# Patient Record
Sex: Male | Born: 1950 | Race: Black or African American | Hispanic: No | Marital: Married | Smoking: Former smoker
Health system: Southern US, Community
[De-identification: ages and names within clinical notes are randomized; demographics above are authoritative.]

## PROBLEM LIST (undated history)

## (undated) DIAGNOSIS — Z972 Presence of dental prosthetic device (complete) (partial): Secondary | ICD-10-CM

## (undated) DIAGNOSIS — Z9089 Acquired absence of other organs: Secondary | ICD-10-CM

## (undated) DIAGNOSIS — I509 Heart failure, unspecified: Secondary | ICD-10-CM

## (undated) DIAGNOSIS — N183 Chronic kidney disease, stage 3 unspecified: Secondary | ICD-10-CM

## (undated) DIAGNOSIS — F4321 Adjustment disorder with depressed mood: Secondary | ICD-10-CM

## (undated) DIAGNOSIS — M726 Necrotizing fasciitis: Secondary | ICD-10-CM

## (undated) DIAGNOSIS — I700 Atherosclerosis of aorta: Secondary | ICD-10-CM

## (undated) DIAGNOSIS — I2510 Atherosclerotic heart disease of native coronary artery without angina pectoris: Secondary | ICD-10-CM

## (undated) DIAGNOSIS — E039 Hypothyroidism, unspecified: Secondary | ICD-10-CM

## (undated) DIAGNOSIS — H4311 Vitreous hemorrhage, right eye: Secondary | ICD-10-CM

## (undated) DIAGNOSIS — I10 Essential (primary) hypertension: Secondary | ICD-10-CM

## (undated) DIAGNOSIS — N400 Enlarged prostate without lower urinary tract symptoms: Secondary | ICD-10-CM

## (undated) DIAGNOSIS — I219 Acute myocardial infarction, unspecified: Secondary | ICD-10-CM

## (undated) DIAGNOSIS — F17211 Nicotine dependence, cigarettes, in remission: Secondary | ICD-10-CM

## (undated) DIAGNOSIS — L602 Onychogryphosis: Secondary | ICD-10-CM

## (undated) DIAGNOSIS — E119 Type 2 diabetes mellitus without complications: Secondary | ICD-10-CM

## (undated) DIAGNOSIS — M549 Dorsalgia, unspecified: Secondary | ICD-10-CM

## (undated) DIAGNOSIS — M1990 Unspecified osteoarthritis, unspecified site: Secondary | ICD-10-CM

## (undated) DIAGNOSIS — E669 Obesity, unspecified: Secondary | ICD-10-CM

## (undated) DIAGNOSIS — K760 Fatty (change of) liver, not elsewhere classified: Secondary | ICD-10-CM

## (undated) DIAGNOSIS — E785 Hyperlipidemia, unspecified: Secondary | ICD-10-CM

## (undated) DIAGNOSIS — H6121 Impacted cerumen, right ear: Secondary | ICD-10-CM

## (undated) HISTORY — DX: Vitreous hemorrhage, right eye: H43.11

## (undated) HISTORY — DX: Nicotine dependence, cigarettes, in remission: F17.211

## (undated) HISTORY — DX: Atherosclerosis of aorta: I70.0

## (undated) HISTORY — DX: Dorsalgia, unspecified: M54.9

## (undated) HISTORY — DX: Necrotizing fasciitis: M72.6

## (undated) HISTORY — DX: Atherosclerotic heart disease of native coronary artery without angina pectoris: I25.10

## (undated) HISTORY — DX: Chronic kidney disease, stage 3 unspecified: N18.30

## (undated) HISTORY — DX: Heart failure, unspecified: I50.9

## (undated) HISTORY — DX: Adjustment disorder with depressed mood: F43.21

## (undated) HISTORY — DX: Hyperlipidemia, unspecified: E78.5

## (undated) HISTORY — DX: Hypothyroidism, unspecified: E03.9

## (undated) HISTORY — DX: Presence of dental prosthetic device (complete) (partial): Z97.2

## (undated) HISTORY — DX: Benign prostatic hyperplasia without lower urinary tract symptoms: N40.0

## (undated) HISTORY — DX: Unspecified osteoarthritis, unspecified site: M19.90

## (undated) HISTORY — DX: Onychogryphosis: L60.2

## (undated) HISTORY — DX: Obesity, unspecified: E66.9

## (undated) HISTORY — PX: COLON SURGERY: SHX602

## (undated) HISTORY — DX: Acquired absence of other organs: Z90.89

## (undated) HISTORY — DX: Impacted cerumen, right ear: H61.21

## (undated) HISTORY — DX: Fatty (change of) liver, not elsewhere classified: K76.0

---

## 1999-04-25 ENCOUNTER — Encounter: Admission: RE | Admit: 1999-04-25 | Discharge: 1999-07-24 | Payer: Self-pay | Admitting: *Deleted

## 1999-08-07 ENCOUNTER — Ambulatory Visit (HOSPITAL_COMMUNITY): Admission: RE | Admit: 1999-08-07 | Discharge: 1999-08-07 | Payer: Self-pay | Admitting: *Deleted

## 2000-02-19 ENCOUNTER — Encounter: Admission: RE | Admit: 2000-02-19 | Discharge: 2000-02-26 | Payer: Self-pay | Admitting: *Deleted

## 2000-04-15 ENCOUNTER — Encounter: Admission: RE | Admit: 2000-04-15 | Discharge: 2000-07-14 | Payer: Self-pay | Admitting: *Deleted

## 2002-04-08 ENCOUNTER — Emergency Department (HOSPITAL_COMMUNITY): Admission: EM | Admit: 2002-04-08 | Discharge: 2002-04-08 | Payer: Self-pay | Admitting: Emergency Medicine

## 2002-04-09 ENCOUNTER — Encounter: Payer: Self-pay | Admitting: Family Medicine

## 2002-04-09 ENCOUNTER — Inpatient Hospital Stay (HOSPITAL_COMMUNITY): Admission: EM | Admit: 2002-04-09 | Discharge: 2002-05-01 | Payer: Self-pay | Admitting: Emergency Medicine

## 2002-04-09 ENCOUNTER — Encounter: Payer: Self-pay | Admitting: Emergency Medicine

## 2002-04-10 HISTORY — PX: OTHER SURGICAL HISTORY: SHX169

## 2002-04-13 ENCOUNTER — Encounter: Payer: Self-pay | Admitting: Family Medicine

## 2002-04-17 ENCOUNTER — Encounter: Payer: Self-pay | Admitting: General Surgery

## 2002-04-28 ENCOUNTER — Encounter: Admission: RE | Admit: 2002-04-28 | Discharge: 2002-04-28 | Payer: Self-pay | Admitting: Family Medicine

## 2002-05-07 ENCOUNTER — Encounter: Payer: Self-pay | Admitting: Plastic Surgery

## 2002-05-08 ENCOUNTER — Inpatient Hospital Stay (HOSPITAL_COMMUNITY): Admission: RE | Admit: 2002-05-08 | Discharge: 2002-05-13 | Payer: Self-pay | Admitting: Plastic Surgery

## 2002-05-08 HISTORY — PX: SKIN GRAFT: SHX250

## 2002-07-22 ENCOUNTER — Encounter: Payer: Self-pay | Admitting: General Surgery

## 2002-07-27 ENCOUNTER — Encounter (INDEPENDENT_AMBULATORY_CARE_PROVIDER_SITE_OTHER): Payer: Self-pay | Admitting: *Deleted

## 2002-07-27 ENCOUNTER — Inpatient Hospital Stay (HOSPITAL_COMMUNITY): Admission: RE | Admit: 2002-07-27 | Discharge: 2002-08-01 | Payer: Self-pay | Admitting: General Surgery

## 2002-07-27 HISTORY — PX: OTHER SURGICAL HISTORY: SHX169

## 2003-06-17 ENCOUNTER — Ambulatory Visit (HOSPITAL_COMMUNITY): Admission: RE | Admit: 2003-06-17 | Discharge: 2003-06-17 | Payer: Self-pay | Admitting: Orthopedic Surgery

## 2003-06-17 ENCOUNTER — Encounter: Payer: Self-pay | Admitting: Orthopedic Surgery

## 2006-11-21 ENCOUNTER — Encounter: Admission: RE | Admit: 2006-11-21 | Discharge: 2006-11-21 | Payer: Self-pay | Admitting: Cardiology

## 2007-11-05 ENCOUNTER — Encounter: Admission: RE | Admit: 2007-11-05 | Discharge: 2007-11-05 | Payer: Self-pay | Admitting: Cardiology

## 2008-02-22 ENCOUNTER — Inpatient Hospital Stay (HOSPITAL_COMMUNITY): Admission: EM | Admit: 2008-02-22 | Discharge: 2008-02-25 | Payer: Self-pay | Admitting: Emergency Medicine

## 2008-02-22 ENCOUNTER — Ambulatory Visit: Payer: Self-pay | Admitting: Cardiology

## 2008-02-23 ENCOUNTER — Encounter (INDEPENDENT_AMBULATORY_CARE_PROVIDER_SITE_OTHER): Payer: Self-pay | Admitting: Emergency Medicine

## 2008-09-21 ENCOUNTER — Ambulatory Visit (HOSPITAL_COMMUNITY): Admission: RE | Admit: 2008-09-21 | Discharge: 2008-09-21 | Payer: Self-pay | Admitting: Cardiology

## 2008-09-21 ENCOUNTER — Ambulatory Visit: Payer: Self-pay | Admitting: Vascular Surgery

## 2008-09-21 ENCOUNTER — Encounter (INDEPENDENT_AMBULATORY_CARE_PROVIDER_SITE_OTHER): Payer: Self-pay | Admitting: Cardiology

## 2008-11-24 ENCOUNTER — Encounter (HOSPITAL_COMMUNITY): Admission: RE | Admit: 2008-11-24 | Discharge: 2009-02-22 | Payer: Self-pay | Admitting: Cardiology

## 2009-09-27 ENCOUNTER — Ambulatory Visit: Payer: Self-pay | Admitting: Vascular Surgery

## 2009-09-27 ENCOUNTER — Encounter (INDEPENDENT_AMBULATORY_CARE_PROVIDER_SITE_OTHER): Payer: Self-pay | Admitting: Cardiology

## 2009-09-27 ENCOUNTER — Ambulatory Visit (HOSPITAL_COMMUNITY): Admission: RE | Admit: 2009-09-27 | Discharge: 2009-09-27 | Payer: Self-pay | Admitting: Cardiology

## 2011-01-11 ENCOUNTER — Other Ambulatory Visit (HOSPITAL_COMMUNITY): Payer: Self-pay | Admitting: Cardiology

## 2011-01-11 DIAGNOSIS — I209 Angina pectoris, unspecified: Secondary | ICD-10-CM

## 2011-02-07 ENCOUNTER — Ambulatory Visit (HOSPITAL_COMMUNITY): Admission: RE | Admit: 2011-02-07 | Payer: Self-pay | Source: Ambulatory Visit

## 2011-02-07 ENCOUNTER — Encounter (HOSPITAL_COMMUNITY)
Admission: RE | Admit: 2011-02-07 | Discharge: 2011-02-07 | Disposition: A | Discharge status: Home / Self Care | Payer: Medicare Other | Source: Ambulatory Visit | Attending: Cardiology | Admitting: Cardiology

## 2011-02-07 ENCOUNTER — Ambulatory Visit (HOSPITAL_COMMUNITY)
Admission: RE | Admit: 2011-02-07 | Discharge: 2011-02-07 | Payer: Self-pay | Source: Ambulatory Visit | Attending: Cardiology | Admitting: Cardiology

## 2011-02-07 DIAGNOSIS — I209 Angina pectoris, unspecified: Secondary | ICD-10-CM

## 2011-02-07 DIAGNOSIS — Z8249 Family history of ischemic heart disease and other diseases of the circulatory system: Secondary | ICD-10-CM | POA: Insufficient documentation

## 2011-02-07 DIAGNOSIS — E119 Type 2 diabetes mellitus without complications: Secondary | ICD-10-CM | POA: Insufficient documentation

## 2011-02-07 MED ORDER — TECHNETIUM TC 99M TETROFOSMIN IV KIT
30.0000 | PACK | Freq: Once | INTRAVENOUS | Status: AC | PRN
  Administered 2011-02-07: 30 via INTRAVENOUS

## 2011-02-07 MED ORDER — TECHNETIUM TC 99M TETROFOSMIN IV KIT
10.0000 | PACK | Freq: Once | INTRAVENOUS | Status: AC | PRN
  Administered 2011-02-07: 10 via INTRAVENOUS

## 2011-02-20 NOTE — Consult Note (Signed)
[name], [name] NO.:  [id number]   MEDICAL RECORD NO.:  [id number]          PATIENT TYPE:  INP   LOCATION:  2912                         FACILITY:  [facility]   PHYSICIAN:  Lonia Blood, M.D.DATE OF BIRTH:  Dec 17, 1950   DATE OF CONSULTATION:  DATE OF DISCHARGE:                                 CONSULTATION   REFERRING PHYSICIAN:  Jonelle Sidle, MD   PRIMARY CARE PHYSICIAN:  Osvaldo Shipper. Spruill, M.D.   REASON FOR CONSULTATION:  Severely uncontrolled diabetes mellitus.   HISTORY OF PRESENT ILLNESS:  Mr. Zylstra is a 60 year old gentleman  with a known long standing history of diabetes mellitus.  He is being  admitted to the cardiology service under suspicion of possible acute  coronary syndrome.  During his admission, it has been appreciated that  he is suffering with severely uncontrolled diabetes.  Serum glucose was  noted to 694 in the emergency room and capillary blood glucose has been  in excess of 800.  While the cardiology team is emergently attending to  the patient's possible acute coronary syndrome, they have attempted to  reach the patient's primary care physician for control of his  hyperglycemia.  Unfortunately, they were not able to do so.  As a  result, they have asked if the [facility] service would be willing to  attend the patient's diabetes and have agreed to do so.   The patient states that he was sitting in church today when he suddenly  began to experience generalized severe weakness.  Apparently, shortly  thereafter he passed out.  EMS was summoned and the patient was  transported to the ER.  For details concerning his presentation, please  see dictated history and physical as per the cardiology team.  At the  present time, the patient is resting in his coronary ICU bed.  He denies  chest complaints.  He denies fevers, chills, nausea, or vomiting.  He  denies any recent illnesses.  He states that he has been compliant with  all  of his medications though past records raise the question of  possible medical noncompliance.   REVIEW OF SYSTEMS:  Unremarkable with the exception of positive elements  noted in the history of present illness above.   PAST MEDICAL HISTORY:  1. History of Fornes gangrene, 2003.      a.     Extensive multiple I&Ds.      b.     Diverting colostomy with ultimate takedown of colostomy.  2. Multiple split thickness skin grafts required for wound closure.  3. Diabetes mellitus, type 2.  4. 40 pack year history of tobacco abuse - discontinued 3 years ago.  5. Hyperlipidemia.   OUTPATIENT MEDICATIONS:  1. Actoplus MET, 500/15 b.i.d.  2. Zocor, unknown dose every day.  3. Aspirin every day.  4. Blood pressure medication, unknown.   ALLERGIES:  No known drug allergies   FAMILY HISTORY:  The patient's father is deceased from lung cancer but  was a smoker and also was known to have had asbestos exposure.  The  patient's mother s alive.  There is no apparent  history of early  coronary artery disease in the family.   SOCIAL HISTORY:  The patient occasionally partakes of alcohol.  He is  married.  He is disabled from factory work.   DATA REVIEWED:  CBC is unremarkable.  CMET is remarkable for a low  sodium at 128, elevated creatinine at 1.9, serum glucose of 672.  LFTs  are normal.  Albumin of 3.9.  Urinalysis reveals ketones and greater  than 100 glucose but is otherwise unremarkable.  Coags are normal.  D-  dimer is elevated at 0.5.  CT scan of the head reveals no acute disease.  Chest x-ray reveals no acute disease.  LDL is noted to be 150 with an  HDL of 37.  EKGs are reviewed, see cardiology comments.   PHYSICAL EXAMINATION:  Temperature is not really available but the  patient is not febrile to simple touch.  Blood pressure is 95/100, heart  rate 70, respiratory rate 24, oxygen saturation is 99% on 4 liters per  minute nasal cannula.  GENERAL:  Well-developed, well-nourished  gentleman in acute respiratory  distress.  HEENT:  Normocephalic, atraumatic.  Pupils equal, round, and reactive to  light and accommodation.  Extraocular muscles intact bilaterally.  __________ clear.  LUNGS:  Clear to auscultation bilaterally without wheezes or rhonchi.  CARDIOVASCULAR:  Distant but regular without gallop or rub.  ABDOMEN:  Nontender, nondistended, soft.  Bowel sounds present.  No  hepatosplenomegaly, no rebound, no ascites.  EXTREMITIES:  No significant clubbing, cyanosis, or edema of bilateral  lower extremities.  NEUROLOGIC:  Alert and oriented but somewhat lethargic.  Cranial nerves  II-XII intact bilaterally, 5/5 strength in bilateral  upper and lower  extremities, intact sensation to touch throughout.   RECOMMENDATIONS:  1. Severe hyperglycemia without evidence of diabetic ketoacidosis:  We      will initiate Glu commander protocol.  Orders have been filled out      and placed within the patient's chart.  Because of his severe      hyperglycemia, we will place the patient on n.p.o. status with      exception to non-caloric clear liquids while he is on an insulin      drip.  We will follow his potassium very closely as infusion of IV      insulin in the setting of severe hyperglycemia frequently causes      hyperkalemia.  We will recheck the potassium tonight and supplement      as appropriate.  At such timet that the patient's blood sugar      becomes well controlled on insulin drip, we will administer Lantus      insulin and then transition the patient appropriately to a sliding      scale of insulin.  Oral medications will not be resumed until close      to the time for the patient's discharge.  There is no clear      inciting etiology for the patient's severe hyperglycemia.      Certainly, noncompliance could be a component. We will check a      hemoglobin A1c with out next lab test.  2. Renal insufficiency:  The patient has no history of renal       insufficiency and has a baseline creatinine of 1.0 as measured      during his hospitalization in 2003.  I expect his current renal      insufficiency is simply secondary to a severe volume depletion in  the setting of marked hyperglycemia.  I will hydrate the patient      aggressively with 200-cc per hour of normal saline in addition to      any fluids recommended by the glucose stabilizer.  We will then cut      him back to a rate of 150-cc an hour.  The patient is not known to      have a history of heart failure and given his relative young age, I      feel that he can tolerate these fluids.  We will followup a renal      function panel in the morning.  3. Hyponatremia:  This is clearly a pseudohyponatremia due to severe      hyperglycemia.  With correction of the patient's hyperglycemia, I      suspect that his sodium will normalize.   Thank you very much for your consultation on this pleasant gentleman.  I  am aware that Dr. Shana Chute is his primary care physician.  I have been  informed that multiple attempts were made to contact Dr. Shana Chute and  that these attempts have failed.  I am happy care for Mr. Silverstein at  the present time.  I will attempt to call Dr. Shana Chute myself in the  morning and if he is able to be contacted, I will be happy to turn over  ongoing diabetes management to Dr. Shana Chute.  In the meantime, the  [facility] service is happy to provide ongoing consultative care for the  patient's severe hyperglycemia.      Lonia Blood, M.D.  Electronically Signed     JTM/MEDQ  D:  02/22/2008  T:  02/22/2008  Job:  [id number]   cc:   Osvaldo Shipper. Spruill, M.D.  Jonelle Sidle, MD

## 2011-02-20 NOTE — Discharge Summary (Signed)
[name], [name]            ACCOUNT NO.:  [id number]   MEDICAL RECORD NO.:  [id number]          PATIENT TYPE:  INP   LOCATION:  2002                         FACILITY:  [facility]   PHYSICIAN:  Osvaldo Shipper. Spruill, M.D.DATE OF BIRTH:  Nov 15, 1950   DATE OF ADMISSION:  02/22/2008  DATE OF DISCHARGE:  02/25/2008                               DISCHARGE SUMMARY   DISCHARGE DIAGNOSES:  1. Diabetes mellitus, adult onset.  2. Hypoosmolality.  3. Volume depletion.  4. Hypotension.  5. Hypertension.  6. Hyperlipidemia.   The initial history and physical for this patient was completed by Dr.  Nona Dell, cardiology.  It was initially felt that the patient was  a cardiac STEMI patient.  This is a 59 year old male with no previous  history of coronary artery disease who had not been feeling his usual  self a day or two prior to this hospital admission.  He went to church  on Feb 22, 2008.  While at church he became weak, he had some  diaphoresis with nausea, had some pain in the left arm and left neck.  There was no significant chest pain during that time.  EMS was called  and at that time the patient had nonspecific ST changes in the  anterolateral leads.  His systolic blood pressure was low at 80.  The  patient was given IV fluids and was transported to the emergency  department.  Upon his arrival to the emergency department he continued  to feel weak, continued to have neck pain and arm pain.  He had a  bedside echocardiogram which revealed no significant wall motion  abnormalities.  He was examined and his labs during the emergency  department visit revealed an electrocardiogram of normal sinus rhythm  with a rate of 70 and a suggestion of early repolarization, low  probability of injury.  His hemoglobin and hematocrit were within normal  limits.  His white blood cell count on the CBC was also within normal  limits as well as the platelets.  The sodium was low at 129.  The  creatinine was elevated at 1.89 with a regular filtration rate of 45.  The glucose was significantly elevated at 694.  The CK-MB was 117/2.5,  the troponin-I was 0.03 and the total cholesterol was 225 with  triglycerides of 191.  At this point, it was the opinion that the  patient should be admitted to the intensive care unit for regaining  control of his diabetes.   The patient was admitted to the intensive care unit.  He was treated  with IV fluids and he was placed on glucose stabilizer (Glucommander)  and he was seen by internal medicine Dr. Rudi Coco.  The patient  continued to show improvement.  His intake and output remained good.  He  had an echocardiogram and while the images were suboptimal the left  ventricular function showed no definite wall motion abnormalities.  His  serial cardiac markers remained negative for acute event   The patient's glucose was slow to come under control with his previous  medications and plans were made to initiate insulin, this  was done.  The  patient was seen by diabetes coordinator and the patient and the family  were educated on the insulin as well as its administration.  The  patient's diet and activities were also reviewed and the patient was re-  educated concerning this.   The patient's care was assumed by Dr. Corrinne Eagle on May 19.  The patient was  transferred from the intensive care unit.  He showed a steady  improvement.  Arrangements were made with advanced home care to continue  the patient's diabetes education and followup and on Feb 25, 2008, it  was the opinion of the attending physician and consulting physicians  that the patient had received maximum benefit from this hospitalization  and could be discharged home.   DISCHARGE MEDICATIONS:  1. Vicodin ES 1 every 4 hours as needed.  2. Lasix 40 mg daily.  3. Synthroid 100 mcg daily.  4. Potassium 20 mEq daily.  5. Simcor 20 mg daily.  6. Lantus insulin 30 units subcu daily.  7.  Diovan 320 mg daily.   FOLLOWUP:  The patient is to be seen in the office in 1-2 weeks, sooner  if any changes, problems or concerns.      Ivery Quale, P.A.      Osvaldo Shipper. Spruill, M.D.  Electronically Signed    HB/MEDQ  D:  03/23/2008  T:  03/24/2008  Job:  [id number]

## 2011-02-20 NOTE — H&P (Signed)
NAMEZAXTON, ANGERER NO.:  [id number]   MEDICAL RECORD NO.:  [id number]          PATIENT TYPE:  INP   LOCATION:  2912                         FACILITY:  [facility]   PHYSICIAN:  Jonelle Sidle, MD DATE OF BIRTH:  16-Feb-1951   DATE OF ADMISSION:  02/22/2008  DATE OF DISCHARGE:                              HISTORY & PHYSICAL   PRIMARY CARE PHYSICIAN:  Osvaldo Shipper. Spruill, MD.   CHIEF COMPLAINT:  Nausea, weakness, arm pain.   HISTORY OF PRESENT ILLNESS:  Mr. [name] is a 60 year old male with no  previous history of coronary artery disease.  He felt slightly under  the weather, but went to church today and had onset of weakness  associated with diaphoresis and nausea.  He had left neck and arm pain  as well, but no frank chest pain.  EMS was called, and his EKG  demonstrated largely nonspecific ST abnormalities in the anterolateral  leads.  His systolic blood pressure was in the 80s and he was given  fluids.  He was transported to the emergency department.  He was  somewhat confused but had no focal motor deficits. Of note, he had taken  at least 1 baby aspirin this morning.  In the emergency room, Mr.  Batterman stated that he was feeling weak and was still having some neck  and left arm discomfort.  A bedside echocardiogram showed no focal wall  motion abnormalities and overall preserved left ventricular ejection  fraction.  Additionally, his blood sugar was almost 700 and his  creatinine was 2.   PAST MEDICAL HISTORY:  1. Diabetes mellitus, poorly controlled.  2. Hypertension.  3. Hyperlipidemia.  4. Remote history of tobacco use.  5. History of necrotizing fasciitis and Fournier gangrene.   SURGICAL HISTORY:  He is status post several procedures to incision and  drain his lower abdomen as well as a colostomy.   ALLERGIES:  No known drug allergies.   MEDICATIONS:  1. (Dose is unclear) Actos plus metformin.  2. Zocor.  3. Aspirin 81 mg.  4. Unknown  blood pressure medicine.   SOCIAL HISTORY:  He lives in [place] with his wife and is disabled.  He has an approximately 40-pack-year history of tobacco use, but quit 3  years ago.  He did not abuse alcohol, but quit it completely in November  2008, and denies drug abuse.   FAMILY HISTORY:  His mother is alive at age 7 with a history of asthma,  but no heart disease.  His father died at age 59 of cancer, but no heart  disease.  He has 1 sister with no history of coronary artery disease.   REVIEW OF SYSTEMS:  He had diaphoresis and general malaise.  He had some  shortness of breath with this and he has a bit of chronic dyspnea on  exertion, but it is not extreme.  He has not had orthopnea, PND, or  edema.  There is a questionable history of palpitations.  His symptoms  are consistent with presyncope, possibly secondary to hypotension.  He  has chronic nocturia.  He has been weak today.  He has occasional  arthralgias.  Full 14-point review of systems is otherwise negative.   PHYSICAL EXAM:  VITAL SIGNS:  He is afebrile.  Blood pressure 95/50,  pulse 70, respiratory rate 24, and O2 saturation 99% on 4 L.  GENERAL:  He is a well-developed, overweight African-American male in  mild distress.  HEENT:  Normal.  NECK:  There is no lymphadenopathy, thyromegaly, bruit, or JVD noted.  CV:  His heart is regular rate and rhythm with no significant murmur,  rub, or gallop noted.  Distal pulses are slightly decreased in his lower  extremities, but no femoral bruits are appreciated.  Femoral pulses  themselves are 1+.  LUNGS:  He has decreased breath sounds in the bases, but generally  clear.  SKIN:  No rashes or lesions are noted.  ABDOMEN:  Firm and nontender with active bowel sounds.  EXTREMITIES:  There is no cyanosis, clubbing, or edema noted.  MUSCULOSKELETAL:  There is no joint deformity or effusion and no spine  or CVA tenderness.  NEUROLOGIC:  He is alert and oriented.  Cranial  nerves II through XII  grossly intact.  There are no obvious neurologic deficits, although he  has slight confusion and no weakness.   Chest x-ray is pending.   CT of the head was negative for acute abnormality.   EKG is sinus rhythm, rate 70 with possible early repolarization, less  likely injury current.   LABORATORY VALUES:  Hemoglobin 15.3, hematocrit 45, WBCs 5.9, and  platelets 219,000.  INR 0.9.  D-dimer 0.5.  Sodium 129, potassium 4.4,  chloride 97, BUN 20, creatinine 1.89, GFR 45, and glucose 694.  Other  CMET values within normal limits.  CK-MB 117/2.5 with a troponin I of  0.03, total cholesterol 225, triglycerides 191, HDL 37, and LDL 150.   IMPRESSION AND PLAN:  Weakness, nausea, and arm /neck discomfort  associated with a largely nonspecific electrocardiogram, no focal wall  motion abnormalities by echocardiogram, and findings of severe  hyperglycemia with evidence of volume contraction.  Suspect poorly  controlled diabetes as unifying diagnosis with ketotic state.  Will  admit to the ICU for aggressive glucose management under the direction  of Internal Medicine.  Will continue aspirin, Zocor, and IV fluids with  cycling of cardiac markers and follow-up of electrolytes.  Will arrange  a full 2D echocardiogram.  Further plans to follow regarding ischemia  evaluation.      Theodore Demark, PA-C      Jonelle Sidle, MD  Electronically Signed   RB/MEDQ  D:  02/22/2008  T:  02/23/2008  Job:  [id number]

## 2011-02-23 NOTE — Discharge Summary (Signed)
   NAMEJERAY, [name]                        ACCOUNT NO.:  [id number]   MEDICAL RECORD NO.:  [id number]                   PATIENT TYPE:  INP   LOCATION:  5742                                 FACILITY:  [facility]   PHYSICIAN:  Marta Lamas. Gae Bon, M.D.            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   DISPOSITION:  The patient was discharged to home with home health care  assistance.  He is to receive Tylox for pain.  He was no longer on  antibiotics.  He had a followup appointment with Dr. Odis Luster in plastic  surgery and Dr. Isabel Caprice.   PROCEDURES:  1. Debridement of Fournier's gangrene of scrotum, penis, and perineum.  2. Diverting colostomy.  3. Suprapubic tube placement.   FOLLOW UP:  He is to see Dr. Isabel Caprice, Dr. Odis Luster, and Dr. Lindie Spruce.   HOSPITAL COURSE:  I was asked to see this patient after he had been admitted  by Dr. Isabel Caprice for Fournier's of the scrotum and penis for which he had taken  the patient to the OR.  Upon x-ray, he was found to have extension into the  perineum and towards the groins.  We did an open debridement at that time,  and the patient was brought back in 24 to 48 hours for a repeat debridement  and diverting colostomy because of the extensive amount of peritoneal  involvement.   The patient had a suprapubic put in place at the first surgery an no  catheter in penis which has been in because of the necrotic tissue.   The patient did well in spite of the denuded skin, had to remain on the  ventilator for the first couple of days postoperative because of pain  management but was subsequently extubated.  His course after that was  unremarkable.  He was seen by Dr. Odis Luster of plastic surgery who was going to  see the patient as an outpatient for consideration of flap or other type of  coverage.  He was doing well otherwise and will return to see all of [place] in a  couple of weeks after he has improved well enough for skin grafting and  consideration of takedown of his colostomy.                                               Marta Lamas. Gae Bon, M.D.    JOW/MEDQ  D:  06/11/2002  T:  06/13/2002  Job:  [id number]

## 2011-02-23 NOTE — Discharge Summary (Signed)
   [name], [name]                        ACCOUNT NO.:  [id number]   MEDICAL RECORD NO.:  [id number]                   PATIENT TYPE:  INP   LOCATION:  5713                                 FACILITY:  [facility]   PHYSICIAN:  [name], M.D.               DATE OF BIRTH:  [birth year]   DATE OF ADMISSION:  07/27/2002  DATE OF DISCHARGE:  08/01/2002                                 DISCHARGE SUMMARY   DISCHARGE DIAGNOSIS:  Status post take-down colostomy.   DISCHARGE MEDICATIONS:  The patient was discharged to home on Percocet as  needed for pain.   ACTIVITY:  Patient is restricted to lifting no greater than 20 pounds.   DIET:  The patient was discharged with no restrictions on his diet.   WOUND CARE:  The patient can shower and pat his wound dry.   FOLLOW UP:  Patient is to call Dr. Lindie Spruce for an appointment in one week.   HOSPITAL COURSE:  Briefly, the patient was admitted on July 27, 2002 for  take-down of colostomy and repair of a parastomal hernia.  This was done on  July 27, 2002 uneventfully.  The patient had minimal contamination.  He  did extremely well and by postoperative day #3 was tolerating clear liquids  and advanced to full liquids and was then advanced to a soft diet by  postoperative day #4.  The patient was discharged to home on postoperative  day #5 with minimal wound problems, tolerating his diet well.  He is to  return to see me back in my office in one week.                                               [name], M.D.    JW/MEDQ  D:  08/20/2002  T:  08/21/2002  Job:  [id number]

## 2011-02-23 NOTE — Op Note (Signed)
[place]. [facility]  Patient:    DELTA, DESHMUKH Visit Number: [id number] MRN: [id number]          Service Type: EMS Location: [facility] Attending Physician:  Shelba Flake Dictated by:   Jimmye Norman, M.D. Proc. 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  PROCEDURE:  Debridement, irrigation, and drainage of massive Fourniers gangrene of the scrotum, penis, perianal area, and right groin.  SURGEON:  Barron Alvine, M.D. and Jimmye Norman, M.D.  ASSISTANT:  Barron Alvine, M.D. and Jimmye Norman, M.D.  ANESTHESIA:  General endotracheal.  ESTIMATED BLOOD LOSS:  250-300 cc.  COMPLICATIONS:  Bleeding from the debrided tissue.  CONDITION:  Serious.  INDICATIONS FOR PROCEDURE:  I was asked by Dr. Isabel Caprice intraoperatively to consult on this patient with a massive perineum, groin, and abdominal wall infection from apparent Fournier gangrene.  Intraoperative consultation confirmed the clinical diagnosis of Fournier gangrene with a necrotizing fasciitis extending up towards the inguinal canal and the right lower quadrant of the abdominal fascia down towards the rectum and the anus, and completely involving the penile shaft and the scrotum.  DESCRIPTION OF PROCEDURE:  Again, I was asked intraoperatively by Dr. Isabel Caprice to consult on this patient for Fournier gangrene.  When I entered the room, the patient had had almost complete debridement of the scrotal sac from the overwhelming infection.  The remaining scrotal sac had severe anaerobic necrotizing infection involving the subcutaneous plane down to the scrotal dartos fascia.  Because of this, complete  removal of the scrotal sac was necessary and this will be dictated by Dr. Isabel Caprice.  The patients infection also extended posteriorly and laterally, and mostly to the right of the rectum, and not to the left of the rectum, but also extending down that way.  We were able to dissect down into this plane easily using finger dissection, removing large amounts of necrotic foul-smelling, brownish-green necrotic material.  We irrigated it and aspirated it with suction, removing a large portion of this necrotic tissue.  We did take and remove a V-shaped wedge of posterior scrotal tissue down to the deep fascial plane using electrocautery with electrocautery and 3-0 Vicryl suture ligatures being used to control bleeding.  This opened up a large area however.  The necrotic plane appeared to dissect laterally around the perianal area.  We opened up these planes significantly and may need to go back and debride and open them up more.  We also opened up the plane up towards the right groin and a plane just superficial to the external oblique aponeurosis on the right side.  Initially, it did not seem as though the infection was extended that far.  However, once we got into the plane just above the external oblique, that dishwater, foul-smelling anaerobic fluid was encountered, and we opened up the incision up towards the right anterosuperior iliac spine in a slit-like manner, debriding all necrotic tissue in that area.  Once we had opened up this area, it was ready for packing.  During the portion of this course, the entire penile skin and fascia had to be removed because of  the overwhelming infection.  This left a bare penis and also the testicles without any biological covering.  We opened the planes and used half strength Betadine soaked Kerlix gauze to pack up into the right groin down into the posterior perianal and perirectal area, and also into the perineum overlying just underneath the  scrotum underneath the testicles.  The dressing for the testicles were again to be done with Xeroform gauze by Dr. Isabel Caprice.  A suprapubic catheter is also being inserted by Dr. Isabel Caprice.  We will likely need to perform a diverting end-colostomy on this patient to allow for the healing and also further debridement posteriorly for this gangrenous infection.  This will be done at a later time. Dictated by:   Jimmye Norman, M.D. Attending Physician:  Shelba Flake DD:  04/09/02 TD:  04/13/02 Job: [id number]

## 2011-02-23 NOTE — Consult Note (Signed)
[name], [name]                        ACCOUNT NO.:  [id number]   MEDICAL RECORD NO.:  [id number]                   PATIENT TYPE:  INP   LOCATION:  5742                                 FACILITY:  [facility]   PHYSICIAN:  Leland Staszewski DICTATOR                    DATE OF BIRTH:  09-01-1951   DATE OF CONSULTATION:  05/11/2002  DATE OF DISCHARGE:  05/13/2002                                   CONSULTATION   The patient is a 60 year old male referred by Teena Irani. Odis Luster, M.D., for a  dental consultation.  The patient was admitted on May 08, 2002, with  scrotal swelling.  He subsequently underwent debridement of a gangrenous  perineum, penial shaft, and scrotum.  This patient eventually underwent  colonostomy and further debridement with Marta Lamas. Lindie Spruce, M.D.  The patient  subsequently then had a grafting procedure with Teena Irani. Odis Luster, M.D.  A  dental consultation was requested for evaluation of poor dentition and a  loose mandibular anterior tooth.   PAST MEDICAL HISTORY:  1. Fournier's gangrene with open wounds to the penis and perineum.     A. Status post multiple debridements with Marta Lamas. Lindie Spruce, M.D.     B. Status post colostomy and further debridement with Marta Lamas. Lindie Spruce,        M.D.     C. Status post skin grafting with Teena Irani. Odis Luster, M.D.  2. Diabetes mellitus, type 2.   ALLERGIES/ADVERSE DRUG REACTIONS:  None known.   MEDICATIONS:  1. Regular Insulin per sliding scale.  2. Metformin 250 mg three times daily.  3. Docusate 100 mg twice daily.  4. Percocet one to two tablets every six hours as needed for pain.  5. Lovenox 30 mg subcutaneously every 12 hours.   SOCIAL HISTORY:  The patient is a smoker.  Patient with occasional use of  alcohol.  The patient is married.  The patient works in a factory-type  occupation.   FUNCTIONAL ASSESSMENT:  The patient was independent for ADLs prior to this  admission.   REVIEW OF SYSTEMS:  This is reviewed from the chart and the health  history  assessment form this admission.   DENTAL HISTORY:  A dental consultation was requested for evaluation of poor  dentition and a loose mandibular anterior tooth.   HISTORY OF PRESENT ILLNESS:  A dental consultation was requested for  evaluation of a loose mandibular tooth, as well as poor dentition.  The  patient currently denies acute toothache, swelling, or abscesses.  The  patient points to tooth #24 as the loose tooth.  The patient indicates  that the tooth is hanging in there.   The patient indicates that he last saw a dentist at [facility]  approximately three months ago.  The patient was then referred to an oral  surgeon for evaluation for removal of all remaining teeth with  alveoloplasty.  After  adequate healing, the patient will proceed with  fabrication of an upper and lower complete denture.  The patient is  interested in getting all of them out.  Patient with a history of  maxillary acrylic partial denture, which is currently ill fitting.   DENTAL EXAMINATION:  GENERAL APPEARANCE:  The patient is a well-developed,  well-nourished, African American male laying in a hospital bed.  HEAD AND NECK:  There is no palpable lymphadenopathy.  There are no acute  TMJ symptoms.  INTRAORAL:  The patient has normal saliva.  There is no evidence of abscess  formation.  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  DENTITION:  Patient with multiple missing teeth.  I would need a full series  of dental radiographs or panoramic x-ray to identify the present/missing  teeth.  DENTAL CARIES:  Dental caries are noted.  ENDODONTIC:  The patient denies acute pulpitis symptoms.  Will need dental  radiographs to identify periapical pathology.  CROWN AND BRIDGE:  There are no crown or bridge restorations.   PROSTHODONTIC:  Patient with current ill-fitting maxillary acrylic partial  denture.  The patient is interested in getting all of the remaining teeth  extracted with alveoloplasty and preprosthetic surgery as needed.  The  patient will then follow up with a private dentist of his choice for  fabrication of an upper and lower complete denture.  OCCLUSION:  Patient with a poor occlusal scheme secondary to multiple  missing teeth, supereruption and drifting of the unopposed teeth into the  edentulous areas, and lack of replacement of the missing teeth with  clinically acceptable dental restorations.   RADIOGRAPHIC INTERPRETATION:  No dental radiographs were able to be obtained  at this time.   PLAN/RECOMMENDATIONS:  1. I have discussed the risks, benefits, and complications of various     treatment options in relationship to his medical and dental conditions.     We discussed no treatment, removal of the loose tooth #24, removal of all     remaining teeth with alveoloplasty and preprosthetic surgery, periodontal     therapy, root canal therapy, crown and bridge therapy, implant therapy,     and replace any missing teeth as indicated after adequate healing.  The     patient is thinking about whether he wishes to proceed with removal of     the one tooth or if he wishes to wait and have all remaining teeth     extracted in the near future.  The patient is also considering whether he     will return to the oral surgeon for treatment as planned.  The patient is     interested in having all of his teeth removed eventually.  The patient     will then follow up with [facility] for the fabrication of upper     and lower complete dentures as indicated.  The patient should think about     his options and let dental medicine know in the near future.  2. I have discussed the findings with Teena Irani. Odis Luster, M.D., concerning the     findings and treatment options.  If the patient wishes to proceed  with    extraction of tooth #24, will try to schedule time in the dental medicine     clinic as soon as possible to have that tooth removed.  The patient is     aware of the risks associated with aspiration, but is not concerned at     this time and may be interested in having all teeth removed at one time.     If the patient is interested in having all remaining teeth extracted at a     later date, will attempt to get the patient scheduled in the outpatient     clinic as indicated.  3. Suggest initiation of chlorhexidine rinses, rinsing with 50 ml twice     daily at this time.                                                 Aljean Horiuchi DICTATOR    DD/MEDQ  D:  05/12/2002  T:  05/16/2002  Job:  [id number]   cc:   Teena Irani. Odis Luster, M.D.

## 2011-02-23 NOTE — Consult Note (Signed)
[place]. [facility]  Patient:    [name], [name] Visit Number: [id number] MRN: [id number]          Service Type: EMS Location: [facility] Attending Physician:  Shelba Flake Dictated by:   Barron Alvine, M.D. Admit Date:  04/08/2002 Discharge Date: 04/08/2002   CC:         Lesle Chris, M.D. at [facility]  [facility]   Consultation Report  CHIEF COMPLAINT:  Fever, chills and massive scrotal swelling.  HISTORY OF PRESENT ILLNESS:  The patient is a 60 year old African-American male. He denies any previous urologic history. Earlier this evening, I got a call from Dr. Lesle Chris at [facility]. They reported to me that the patient had presented there with a five-day history of fever, chills and progressive scrotal swelling. He states that approximately five days ago he began feeling weak and tired. He also began noticing some discomfort in his right hemiscrotum and groin area. Over the course of the next 4-5 days, he continued to intermittently be febrile with a temperature as high as 103 degrees. He had periodic shaking chills and noticed increased pain and swelling in his right hemiscrotum. He thought things were going to improve spontaneously and did not seek medical attention until the situation clearly got worse. Dr. Cleta Alberts contacted me and was concerned that the patient may have a significant problem within the scrotum including the possibility of a large scrotal abscess. We were also concerned that given his previous history of diabetes mellitus, that he could be developing Fournier gangrene and felt that the patient did need to be evaluated further this evening and also admitted. The patient was sent to the emergency room at [facility] where the family practice teaching service accepted the patient for care. He is denying any significant urologic history or other symptoms. He really reports no dysuria and states  that his voiding has been pretty good. He has no previous history of any scrotal or testicular infections. He has not had any inguinal or scrotal surgery.  PAST MEDICAL HISTORY:  Significant for non-insulin requiring diabetes mellitus. He was under the care of a physician and was prescribed Glucophage in the past. He has not been to a doctor in several years and admits that he has not taken his medication. [facility], his CBG was approximately 300. He has no other major medical problems.  MEDICATIONS:  Takes no medicines.  ALLERGIES:  Has no allergies.  SOCIAL HISTORY:  He is a smoker and social drinker.  REVIEW OF SYSTEMS:  Primarily positive for the above-mentioned things and negative for other significant urologic issues.  PHYSICAL EXAMINATION:  GENERAL:  He is a well-developed, well-nourished male. He is not acutely toxic in appearance and is in only mild distress. He is appropriate and oriented.  VITAL SIGNS:  On transfer, his blood pressure was 120 over palp with a pulse of 120. He was apparently more tachycardic than this at [facility] but has responded to some intravenous fluids.  ABDOMEN:  His lower abdomen is relatively unremarkable. His bladder is not distended. There are no obvious masses. He does have some mild tenderness in the right inguinal region but there is no obvious adenopathy.  GENITOURINARY:  The penis is uncircumcised and shows moderate to massive penile edema. The scrotum itself is markedly enlarged. The skin itself is somewhat tense from the distention. Due to the pigmentation of the skin, it is difficult to say if there  are any grayish areas but certainly there is nothing obvious with regard to necrosis. There is no evidence of crepitance. The skin has no particular odor, and I do not feel that the patient has evidence of Fournier gangrene. There is a quite a bit of bogginess to the scrotum, but it is difficult to determine whether this  is simple reactive hydrocele fluid or potentially an intrascrotal abscess. I do not feel any real fluctuance. There is moderate tenderness but you are unable to palpate either testis or epididymal structure due to the massive swelling.  LABORATORY AND ACCESSORY DATA:  Urinalysis and BMET are pending. The patient does have an elevated serum glucose of approximately 300. His white blood cell count is surprisingly normal.  The patient is scheduled for imaging studies of his scrotum which are pending at this time.  ASSESSMENT:  Fever and chills with massive scrotal swelling. The patient almost certainly has a urologic source of his infection. I suspect that what he probably has is a fairly significant epididymo-orchitis hopefully with a reactive hydrocele. One cannot rule out the possibility of significant intrascrotal abscess. If he does have a large abscess, this will require surgical drainage. The patient will be having a scrotal ultrasound this evening. If he does have an abscess, he will receive intravenous antibiotics over the course of the night and probably have drainage tomorrow. I have recommended broad-spectrum antibiotics including Cipro plus or minus some Ancef to cover gram-positives. He will be kept NPO at this time until we are better able to determine what is going on within the scrotum. He will receive supportive care from the family practice teaching service. Dictated by:   Barron Alvine, M.D. Attending Physician:  Shelba Flake DD:  04/09/02 TD:  04/12/02 Job: [id number]

## 2011-02-23 NOTE — Consult Note (Signed)
[place]. [facility]  Patient:    [name], [name] Visit Number: [id number] MRN: [id number]          Service Type: EMS Location: [facility] Attending Physician:  Shelba Flake Dictated by:   Teena Irani. Odis Luster, M.D. Proc. Date: 04/15/02 Admit Date:  04/08/2002 Discharge Date: 04/08/2002   CC:         Jimmye Norman, M.D.   Consultation Report  CHIEF COMPLAINT:  Fourniers gangrene with complicated open wound of the perineum, genitalia and lower abdominal wall.  HISTORY OF PRESENT ILLNESS: The patient is a 60 year old man who denied any previous urologic problems.  He presented a week ago to the [facility].  He complained of a five-day history of fever and chills, progressive scrotal swelling.  I thought he had pulled a muscle at work.  His fever has been as high as 103 degrees prior to his admission.  His situation was complicated by non-insulin-dependent diabetes for which he had taken oral hypoglycemics although there is some question as regarding his compliance. Consultation requested from the emergency room at [facility]. [facility] and from there he was seen by Urology as well as General Surgery.  He was diagnosed with Fourniers gangrene.  He was taken to surgery for extensive debridement of the penile shaft as well as the scrotum and a suprapubic tube placement.  This was followed by a repeat debridement on April 09, 2002 followed by another debridement April 10, 2002 and an end diverting colostomy.  He had been undergoing once a day dressing changes using saline dressings and plastic surgery consultation requested for evaluation of his wounds.  PAST MEDICAL HISTORY:  Significant for non-insulin-dependent diabetes mellitus.  He has taken glucophage in the past but had not seen a doctor in several years.  CBD prior to admission was approximately 300.  ALLERGIES:  None known.  SOCIAL HISTORY:  He is a smoker.  REVIEW OF SYSTEMS:   As above, otherwise negative.  PHYSICAL EXAMINATION:  Limited to the wounds, lower abdominal wall, genitalia and perineum.  The wounds had yellowish exudate.  There was no evidence of frank abscess.  There was some granulation tissue although it is not very healthy at the present time.  The entire penile shaft and scrotum has been debrided.  There is an incision in the perineum and some of the skin in the pubic region has also been excised.  Review of the chart reveals that his nutritional status is extremely compromised with a free albumin of 4.3.  IMPRESSION:  Fourniers gangrene with complicated open wounds, lower abdomen, scrotum, penis and perineum.  RECOMMENDATIONS:  Would increase the saline dressing to at least b.i.d. and then increase it to q.6h. as he tolerates is.  I would not recommend using a VAC, since these wounds are still very dangerous and I would not "close" the space with a VAC sponge.  He is not ready for reconstruction.  The patient is still febrile.  He very likely may require further debridements.  The wounds are not granulating yet.  His medical condition is not stable enough.  His nutritional status is extremely compromised.  I will be glad to recheck him and at some point, of course, he would be a candidate for reconstruction although the perineal wound and the lower abdominal wounds should close without any reconstruction.  The penile shaft and the testicles would likely require coverage.  The issue as to whether or not the testicles should be  buried in the thigh I will leave to Urology.  It would appear that if he is beyond a reproductive point, this might not be necessary but I will defer to their judgment as far as that goes.  I appreciate the opportunity to see this patient and participate in his care. Dictated by:   Teena Irani. Odis Luster, M.D. Attending Physician:  Shelba Flake DD:  04/15/02 TD:  04/16/02 Job: [id number]

## 2011-02-23 NOTE — H&P (Signed)
[place]. [facility]  Patient:    HILDRED, MOLLICA Visit Number: [id number] MRN: [id number]          Service Type: EMS Location: [facility] Attending Physician:  Shelba Flake Dictated by:   Maryelizabeth Rowan, M.D. Admit Date:  04/08/2002 Discharge Date: 04/08/2002                           History and Physical  CHIEF COMPLAINT:  Scrotal swelling.  HISTORY OF PRESENT ILLNESS:  This 60 year old African-American with past medical history of diabetes mellitus presents with four days of scrotal swelling and fatigue.  He states that he was moving a large machine at work when he felt groin pain four days ago and thought he "pulled a muscle."  He states the swelling in his scrotum began the next day and has increased in size over the last 2-3 days.  He does also state that he had a boil in his perirectal region approximately a week ago that had some mild swelling.  He states he has had fever and chills over the last 24 hours and he presented to [facility] this evening due to severe fever and chills and increased swelling.  [facility] found the patient to be tachycardic and febrile with a temperature of 103.5 with a large scrotal swelling and questionable fluctuant mass.  He was started on IV fluids and Dr. Isabel Caprice was called to consult on arrival in the ER.  PAST MEDICAL HISTORY:  Diabetes mellitus.  PAST SURGICAL HISTORY:  None.  MEDICATIONS: 1. Glucotrol p.r.n., questionable, the patient takes this once a month. 2. Goody powders p.r.n. for headaches, which he takes approximately three    times a week.  ALLERGIES:  No known drug allergies.  FAMILY HISTORY:  Significant for diabetes in his uncle.  Father died of lung cancer a few years ago, was a smoker and asbestos Financial controller.  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 Tobacco use:  20-30 pack-year history.  Alcohol use:  Occasional.  Illicit drug use:   Denies.  REVIEW OF SYSTEMS:  CONSTITUTIONAL:  No weight changes, positive fever and chills.  Positive fatigue.  Generalized weakness.  NEUROLOGIC:  Chronic headaches for years, which he states is related to the stress of his job. HEENT:  No hearing changes, positive blurry vision outgoing of left eye, which he states is relieved when he wears his glasses.  CARDIOVASCULAR:  Denies chest pain or palpitations.  PULMONARY:  Denies shortness of breath, cough, wheezing.  GASTROINTESTINAL:  Denies abdominal pain.  Has had some nausea the last couple days, but no vomiting.  Positive constipation, no diarrhea, melena, hematochezia.  GENITOURINARY:  Positive groin pain and scrotal swelling.  No dysuria, hematuria, or pyuria noted.  No polyuria or polydipsia. HEMATOLOGIC:  No easy bruisability or purpura.  PHYSICAL EXAMINATION:  VITAL SIGNS:  Temperature 101.4 (status post Tylenol two hours ago), blood pressure 136/76, heart rate 122, respiratory rate 20, O2 saturation 96% on room air.  GENERAL:  Pleasant, alert, and oriented, somewhat anxious and diaphoretic.  NEUROLOGIC:  Alert and oriented x2.  Cranial nerves II-XII intact.  No ataxia.  HEENT:  Normocephalic, atraumatic, PERRLA, EOMI, conjunctivae clear, sclerae anicteric.  Oropharynx: Nonerythematous.  Mucosa:  Moist membranes.  Very poor dentition.  NECK:  Supple, no lymphadenopathy, thyromegaly, or JVD.  LUNGS:  Clear to auscultation bilaterally.  No wheezing, rales, or rhonchi.  HEART:  Tachycardic.  Regular, no murmurs, rubs, or gallops appreciated.  ABDOMEN:  Positive bowel sounds, soft, nontender, nondistended, no hepatosplenomegaly appreciated.  GENITOURINARY:  Extreme scrotal and penile swelling, right-sided tenderness to palpation.  No ulcers, weeping, or skin erythema.  LABORATORY:  [facility] shows white blood cells 6.3, hemoglobin 15.5, platelets 100.  Glucose 307.  CBC, BMET, blood cultures, hemoglobin A1c,  UA, urine cultures, and ultrasound pending.  ASSESSMENT/PLAN:  This 60 year old African-American male with fever, chills, and scrotal swelling and pain.  1. Scrotal swelling.  It is not clear if this is due to abscess and possible    tracking abscess from perirectal area versus an epididymitis/orchitis with    reactive hydrocele.  Will obtain a scrotal ultrasound to attempt to    differentiate these two.  Will also begin antibiotic therapy with Ancef and    Cipro.  Will consult Dr. Isabel Caprice for possible surgical drainage versus    medical therapy. 2. Fever.  The patient was started on IV fluids upon transfer.  Will continue    aggressive IV fluid hydration and Tylenol as needed for temps above 101.    Obtain blood culture and urine cultures.  Results pending. 3. Diabetic.  The patient was diagnosed with his diabetes two years ago and    placed on Glucotrol; however, his medical insurance changed and he has not    followed up with a primary M.D. since that time.  He does not follow his    blood sugars at home and only takes his Glucotrol periodically.  Will check    q.a.c., q.h.s. CBGs and a hemoglobin A1c. Dictated by:   Maryelizabeth Rowan, M.D. Attending Physician:  Shelba Flake DD:  04/09/02 TD:  04/12/02 Job: [id number]

## 2011-02-23 NOTE — Op Note (Signed)
NAMEKHALIN, ROYCE                        ACCOUNT NO.:  [id number]   MEDICAL RECORD NO.:  [id number]                   PATIENT TYPE:  INP   LOCATION:  5742                                 FACILITY:  [facility]   PHYSICIAN:  Teena Irani. Odis Luster, M.D.               DATE OF BIRTH:  08/08/1951   DATE OF PROCEDURE:  05/08/2002  DATE OF DISCHARGE:  05/13/2002                                 OPERATIVE REPORT   PREOPERATIVE DIAGNOSIS:  Complicated  open wounds of penis, testicles, lower  abdomen, and perineum secondary to Fournier gangrene.   POSTOPERATIVE DIAGNOSIS:  Complicated open wounds of penis, testicles, lower  abdomen, and perineum secondary to Fournier gangrene.   OPERATION:  1. Preparation of recipient site.  2. Split-thickness skin graft to testicles, penis, lower abdomen, and     perineum greater than 200 cm2.   SURGEON:  Teena Irani. Odis Luster, M.D.   ANESTHESIA:  General.   ESTIMATED BLOOD LOSS:  Minimal.   CLINICAL NOTE:  This 60 year old man has had a very severe infection causing  fasciitis of the genitalia area.  He underwent multiple debridements as well  as a diverting colostomy and a suprapubic cystostomy.  He was left with  complicated open wounds and these have improved.  He now presents for  grafting procedure to the penis, the testicles, and some of the lower  abdominal region as well as the perineum.  He understood the nature of the  procedure, the risks, the possibility of skin graft loss, bleeding,  infection, and associated complications, wound healing problems, possibility  of repeat skin grafting, and wished to proceed.   PROCEDURE:  The patient taken to the operating room and placed supine.  After successful induction of general anesthesia, he was prepped with  Betadine and draped with sterile drapes.  The harvest was performed from the  left thigh using the Zimmer dermatome on a 0.022-inch setting.  The skin  grafts were meshed 1.5:1 and covered with  moistened saline gauze.  The  recipient site was prepared scraping the old granulation tissue and then  irrigating with saline, a total of 3 liters using the pulse irrigation  system. The grafts were applied and they were meticulously secured around  the periphery using skin staples and to one another using tiny Ligaclips.  This was a very tedious process that required piecing the grafts together in  3 dimensions.  Adaptic, 4 x 4s, and a light ABD dressing were applied.  Donor site dressed with scarlet red, 4 x 4s, ABDs.  He was sent to the  recovery room in stable condition having tolerated the procedure well.                                               Teena Irani. Odis Luster,  M.D.    DMB/MEDQ  D:  05/08/2002  T:  05/13/2002  Job:  [id number]

## 2011-02-23 NOTE — Discharge Summary (Signed)
[name], [name]                        ACCOUNT NO.:  [id number]   MEDICAL RECORD NO.:  [id number]                   PATIENT TYPE:  INP   LOCATION:  5742                                 FACILITY:  [facility]   PHYSICIAN:  Teena Irani. Odis Luster, M.D.               DATE OF BIRTH:  [birth year]   DATE OF ADMISSION:  05/08/2002  DATE OF DISCHARGE:  05/13/2002                                 DISCHARGE SUMMARY   FINAL DIAGNOSIS:  Complicated __________ of the lower abdomen, penis,  scrotum and perineum secondary to Fournier's gangrene.   POSTOPERATIVE DIAGNOSIS:  Complicated __________ of the lower abdomen,  penis, scrotum and perineum secondary to Fournier's gangrene.   PROCEDURE PERFORMED:  Preparation of sites and split-venous skin graft to  penis, testicles and lower abdomen on 08 May 2002.   HISTORY OF PRESENT ILLNESS:  This is a 60 year old man with adult onset  diabetes controlled with oral hypoglycemics who had some fairly poor  compliance and developed a significant infection with Fournier's gangrene.  He suffered some significant medical problems and required emergency surgery  for debridement of large areas of tissue as well as colostomy and suprapubic  cystoscopy.  The wounds gradually improved.  He gradually stabilized from  his septic condition.  He was discharged.  He is now readmitted at this time  for reconstructive surgery for the genitalia area for coverage of these  wounds.  Anesthesia and risks were well understood by him and he wished to  proceed.  For further details of history and physical, please see old  records.   HOSPITAL COURSE:  The patient was taken to the operating room on the day of  admission, at which time he underwent the grafting procedure.  He tolerated  this very well.  Postoperatively he did extremely well.  He remained  afebrile.  The grafts were inspected on the second postoperative day and  they appeared to be a full take with all the wounds  closing rapidly.  There  was no evidence of infection.  His blood sugars have been under excellent  control.  By the fifth postoperative day it was felt he was ready to be  discharged.   DISPOSITION:  He is discharged on his diabetic diet.  His wife was  demonstrated the dressing changes and there will be a visiting nurse once a  day for adaptive 4 by 4's.  She understands the importance of avoiding too  much moisture; no shower yet.  The donor site is doing well and can now be  covered with some clothing, since it has dried.   He is started on Percocet 5 mg, total of 40 given, 1-2 p.o. q.6h p.r.n. for  pain.  He is on other medications as before including his oral  hypoglycemics.   I will see him back in the office in 2 weeks for recheck.  He will be up  some at  home but will stay supine whenever he can in order to continue to  promote graft healing.                                                Teena Irani. Odis Luster, M.D.    DMB/MEDQ  D:  05/13/2002  T:  05/17/2002  Job:  [id number]

## 2011-02-23 NOTE — H&P (Signed)
[name], DELANCEY NO.:  [id number]   MEDICAL RECORD NO.:  [id number]                   PATIENT TYPE:  INP   LOCATION:  2550                                 FACILITY:  [facility]   PHYSICIAN:  Jimmye Norman III, M.D.               DATE OF BIRTH:  [birth year]   DATE OF ADMISSION:  07/27/2002  DATE OF DISCHARGE:                                HISTORY & PHYSICAL   CHIEF COMPLAINT:  The patient is a 60 year old gentleman who was admitted to  the hospital on April 09, 2002, with a necrotizing fasciitis and Fournier's  gangrene of the scrotum, perineum, right groin, and the perianal area.  Because of wound healing concerns, a diverting colostomy was done, and now  he is being readmitted for a take-down of his end diverting colostomy.   HISTORY OF PRESENT ILLNESS:  The patient was admitted and taken to the  operating room by Dr. Valetta Fuller for a necrotizing scrotal fasciitis or  Fournier's gangrene on April 09, 2002.  I was asked to provide an  intraoperative consultation at that time for continued debridement of the  perineum and the groin area, where the patient also had severe extensive  perirectal and genital fasciitis.  He was taken back to the operating room  on the morning of April 10, 2002, at which time he underwent a diverting  colostomy.  The patient was a non-insulin-dependent diabetic and continued to take  Glucophage for this condition.  Since then he has undergone further  debridement and skin grafting of his widely debrided area which had been  left open for wound care.  His suprapubic tube has been removed, and the  patient is voiding well.  He has a complete healing of almost 99% healing on  his perineal area.  He now comes in for a colostomy takedown.   PAST MEDICAL HISTORY:  Significant for the non-insulin-dependent diabetes  mellitus.   PAST SURGICAL HISTORY:  As mentioned previously.   ALLERGIES:  No known drug allergies.   SOCIAL  HISTORY:  He is a 1/2-pack-a-day smoker.  He does not drink alcohol.   FAMILY HISTORY:  His father is deceased of some type of cancer.  Mother is  alive with bronchitis.  His brothers and sisters are all living, with no  apparent medical problems.   PHYSICAL EXAMINATION:  GENERAL:  He is a well-nourished, well-developed  gentleman.  HEENT:  He has some dental caries, but has maintained care primarily.  He is  normocephalic, atraumatic, anicteric.  NECK:  Supple, no palpable adenopathy.  CHEST:  Clear to auscultation.  HEART:  A regular rhythm and rate, with no murmurs, gallops, rubs, or  heaves.  ABDOMEN:  Soft, good healing of his midline wound, and a somewhat  protuberant left lower quadrant stoma.  He is very comfortable with his  stoma, and easily probed.  RECTAL:  Deferred.  Most recently  had a barium study done, with diverticular  disease.    PLAN:  Laboratory studies are being done, and the patient will get a  preoperatively GoLYTELY prep from above and a 500 cc, 21% neomycin  prep  from below.  The patient will be taken to the operating room for a take-down  colostomy, and the patient will be provided with perioperative and  postoperatively epidural for pain control.   IMPRESSION:  Take-down colostomy, status post treatment of Fournier's  gangrene.                                                  Kathrin Ruddy, M.D.    JW/MEDQ  D:  07/27/2002  T:  07/27/2002  Job:  [id number]   cc:   Valetta Fuller, M.D.   Dr. Odis Luster

## 2011-02-23 NOTE — Consult Note (Signed)
[place]. [facility]  Patient:    [name], [name] Visit Number: [id number] MRN: [id number]          Service Type: EMS Location: [facility] Attending Physician:  Shelba Flake Dictated by:   Barron Alvine, M.D. Proc. Date: 04/08/02 Admit Date:  04/08/2002 Discharge Date: 04/08/2002                            Consultation Report  ADDENDUM  This is an addendum dictation to the original consultation on the patient, [name]. His original dictation outlines his complete history, but in summary he is a 60 year old diabetic African-American male who presented with a 5-7 day history of fever and chills and a progressively swelling scrotum. The patient presented with massive distention of his scrotum. On initial evaluation, we did not feel that he had obvious necrosis and were awaiting a scrotal ultrasound. On scrotal ultrasonography, imaging of the testicles was somewhat difficult due to massive swelling within the scrotum. In places the scrotal wall appeared thickened, but there was also a lot of soft tissue density. The testicles, however, appeared normal and had relatively normal blood flow. The ultrasound was not really consistent with epididymo-orchitis, but on ultrasonography it appeared that there was some air within the tissue spaces, and on more careful palpation of the patients scrotum I could appreciate some crepitance. While there was not any obvious external necrosis given this scenario that had developed along with the crepitance and what appeared to be air within the soft tissues on scrotal ultrasonograpy, we felt that this patient likely had an underlying necrotizing fasciitis. For that reason, shortly after seeing him we recommended immediate surgical evaluation with exam under anesthesia and probable wide debridement of his genitalia. We explained this to the patient and his wife who appeared to understand and were in agreement. We  also contacted one of the general surgeons on call to inform them that we had this case and that their assistance may be necessary in the OR. We spent from approximately 10:30 until 1:30 in the a.m. with the patient obtaining history, giving physical exam, reviewing the ultrasound and discussing things with the family in preparation for surgery. The surgical portion of his evaluation and treatment is dictated separately as an operative report. Dictated by:   Barron Alvine, M.D. Attending Physician:  Shelba Flake DD:  04/09/02 TD:  04/12/02 Job: [id number]

## 2011-02-23 NOTE — Op Note (Signed)
[place]. [facility]  Patient:    [name], [name] Visit Number: [id number] MRN: [id number]          Service Type: EMS Location: [facility] Attending Physician:  Shelba Flake Dictated by:   Jimmye Norman, M.D. Proc. Date: 04/10/02 Admit Date:  04/08/2002 Discharge Date: 04/08/2002                             Operative Report  PREOPERATIVE DIAGNOSIS:  Necrotizing fasciitis of perineum, scrotum, perianal area.  POSTOPERATIVE DIAGNOSIS:  Necrotizing fasciitis of perineum, scrotum, perianal area.  OPERATION: 1. Diverting end colostomy. 2. Repeat debridement of peritoneum and perirectal area.  SURGEON:  Jimmye Norman, M.D.  ASSISTANT:  Abigail Miyamoto, M.D.  ANESTHESIA:  General endotracheal anesthesia.  ESTIMATED BLOOD LOSS:  Less than 50 cc.  COMPLICATIONS:   None.  CONDITION:  Critical.  INDICATIONS:  The patient had a Fourniers gangrene debrided approximately 24 to 36 hours ago who now requires diverting end colostomy and repeat debridement because of continuing disease and need to bypass the process.  DESCRIPTION OF PROCEDURE:  The patient was taken to the operating room and placed on the table in supine position.  For debridement of his perineum, he was placed in lithotomy after the colostomy was done.  A midline incision was made from just above the umbilicus to just below the umbilicus.  It was taken down to and through the midline fascia using electrocautery.  Once this was done, we were able to mobilize the sigmoid colon up into the wound.  It was fairly redundant in the proximal sigmoid colon.  We were able to come across with a GIA 75 stapler.   We then mobilized the mesentery in order to provide an adequate limb for bringing out the left mid to lower quadrant of the abdomen.  A colostomy site was made using Kocher clamp and #10 blade using electrocautery to cut out the excessive fat.  We split the rectus fascia and then went  through the rectus muscle.  Through this, we brought out the proximal sigmoid colon.  We matured it with 3-0 Vicryl suture after transecting the stapled edge.  Prior to maturing the colostomy, however, we did irrigate with saline in the belly and then closed the abdomen using running #1 PDS suture on the fascia. The skin was closed using stainless steel staples.  The suprapubic tube, which was then placed, was just distal to the inferior aspect of the lower abdominal incision.  Once the stoma was made, a bag was placed, and a dressing placed on the wound, we placed the patient in stirrups and again debrided his perineum.  The most indurated and necrotic area was along the right buttock area.  We made a V-shaped incision along the indurated area and debrided out more necrotic subcutaneous fatty material.  Once this was done, electrocautery was used to provide hemostasis.  We debrided more tissue proximally along the penile shaft including the groin subcutaneous tissue also.  All bleeding was controlled with electrocautery.  We then packed with three saline-soaked Kerlix gauzes and ABD.  The penile shaft and the testicles were wrapped with Vaseline gauze and Adaptic to provide moisture.  These will be changed in the morning. Dictated by:   Jimmye Norman, M.D. Attending Physician:  Shelba Flake DD:  04/10/02 TD:  04/14/02 Job: [id number]

## 2011-02-23 NOTE — Op Note (Signed)
[facility]  Patient:    OVADIA, LOPP Visit Number: [id number] MRN: [id number]          Service Type: EMS Location: [facility] Attending Physician:  Shelba Flake Dictated by:   Barron Alvine, M.D. Proc. Date: 04/09/02 Admit Date:  04/08/2002 Discharge Date: 04/08/2002   CC:         Jimmye Norman, M.D.  Dr. Earl Lites   Operative Report  PREOPERATIVE DIAGNOSIS:  Necrotizing fasciitis.  POSTOPERATIVE DIAGNOSIS:  Necrotizing fasciitis.  PROCEDURE:  Extensive perirectal and genital debridement with extensive excision of involved skin, drainage, and flexible cystoscopy with suprapubic tube placement.  SURGEON:  Barron Alvine, M.D.  COSURGEON:  Jimmye Norman, M.D.  ANESTHESIA:  General.  INDICATIONS:  The patient is a 60 year old male.  He has been diagnosed with noninsulin-requiring diabetes mellitus in the past, but was not currently taking any of his medication.  Approximately five to seven days ago, he began experiencing nonspecific complaints of lethargy along with fever and chills. He subsequently began developing some mild perirectal and scrotal discomfort. The pain intensified within the scrotum and right inguinal region and he began noting significant scrotal edema.  The patient continued to ignore these symptoms until approximately seven days later.  At that time, his wife convinced him to present to [facility].  There, they found the patient to be very tachycardic and probably uroseptic.  He had a massively enlarged scrotum and I was contacted by phone.  We recommended that the patient be transferred to the emergency room.  Evaluation revealed an again massive dilation/enlargement of the scrotum.  There was evidence of crepitants, both on clinical exam as well as obvious on scrotal ultrasound. The testes were normal.  There was no evidence that this was really a scrotal abscess, but rather felt this was a necrotizing  fasciitis.  I could appreciate crepitants extending up the abdominal wall.  I spoke at some length with the patient and his wife about the seriousness of this condition.  I felt that it was highly likely that he had a Fournier gangrene or a necrotizing fasciitis with high mortality and then extensive debridement was absolutely necessary. We also contacted Dr. Lindie Spruce to inform him of this case and to be on standby should the findings at the OR be what we expected.  TECHNIQUE AND FINDINGS:  The patient was brought to the operating room where he had the successful induction of general endotracheal anesthesia.  He was placed in the moderate lithotomy position, and prepped and draped in the usual manner.  The patient was uncircumcised and had massive penile edema induration and some crepitants along with scrotal edema and a Foley catheter was not able to be placed.  We made a standard incision in the midline of the scrotum. Upon entering, we could tell immediately that we were dealing with a necrotic process.  There was a typical anaerobic aroma with absolutely dead tissue just beneath the skin surface within the fascial layers superficially.  Both testes were found to be unremarkable as usually is the case in a situation like this. There was marked edema.  The entire scrotal skin had to be debrided.  We contacted Dr. Lindie Spruce who came in and provided intraoperative consultation and then also scrub.  He will dictate a separate portion of the procedure and describe some of the dissection in the perirectal area.  We found the fascia to be dead all down into the perirectal area into both buttocks  through the entire penile shaft up the suprapubic area and into the right inguinal region. We spent 3 to 3-1/2 hours debrided all of this tissue.  I had to completely deglove the penis and remove all of the patients penile skin.  The incision had to be extended well up into the inguinal region.  Tissue was  sent for culture and culturettes were also performed.  Of note, the patient had received preoperative Cipro and Unasyn.  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  Both Dr. Lindie Spruce and I felt that this was the most extensive case we had ever seen and that dissection was tedious and difficult and obviously widespread.  At the completion of the procedure, I was able to perform flexible cystoscopy and place a suprapubic tube.  Dr. Lindie Spruce will plan on fecal diversion at a later date.  He did much of the packing with Betadine soaked Kerlix.  We wrapped the testicles and the penile shaft with some Vaseline gauze soaked with Bacitracin.  The patient remained reasonably stable throughout the procedure and will remain intubated and sent to the intensive care unit. Dictated by:   Barron Alvine, M.D. Attending Physician:  Shelba Flake DD:  04/09/02 TD:  04/13/02 Job: [id number]

## 2011-02-23 NOTE — Op Note (Signed)
NAMEAMADOU, [name]                        ACCOUNT NO.:  [id number]   MEDICAL RECORD NO.:  [id number]                   PATIENT TYPE:  INP   LOCATION:  2550                                 FACILITY:  [facility]   PHYSICIAN:  [name], M.D.               DATE OF BIRTH:  1951/04/10   DATE OF PROCEDURE:  DATE OF DISCHARGE:                                 OPERATIVE REPORT   PREOPERATIVE DIAGNOSIS:  Diverting end-colostomy for Fournier's gangrene.   POSTOPERATIVE DIAGNOSIS:  Diverting end-colostomy for Fournier's gangrene.   PROCEDURE:  Reversal or takedown of colostomy.   SURGEON:  [name], M.D.   ASSISTANT:  [name]. [name], M.D.   ANESTHESIA:  General endotracheal.   ESTIMATED BLOOD LOSS:  Less than 100 cc.   COMPLICATIONS:  None.   CONDITION:  Stable.   INDICATION FOR PROCEDURE:  The patient is a 60 year old male who presented  about nine months ago with a scrotal-perineal Fournier's gangrene, who now  comes in for reversal of his colostomy, which was put in place to allow for  wound healing.   FINDINGS:  The patient had normal anatomy and no other findings intra-  abdominally.   DESCRIPTION OF PROCEDURE:  Prior to preparation for the procedure, the  patient's stoma, which had gone through a preoperative bowel prep, was  sutured closed using a running locking stitch of 2-0 silk.  Subsequently it  was prepped with Betadine in the usual sterile manner after an endotracheal  intubation was administered.   A #10 Blade was used to make a midline incision through the patient's old  midline incision just to the right of the umbilicus, down to just below the  lower portion of his previous incision.  We took it down through the midline  fascia using electrocautery, and the patient was remarkably free of  adhesions.  We were quickly able to isolate the distal sigmoid colon, which  had been left in place, and the proximal portion, which had few adhesions  attached to  it.   Once we had isolated the distal portion and prepared it for the hand-sewn  anastomosis, we took down the end-colostomy from the anterior abdominal wall  by making an incision around it using a #10 blade and dissecting it from the  surrounding subcutaneous tissue using electrocautery.  Once we had passed it  back into the peritoneal cavity, we were able to resect about six inches of  it to prepare the distal end of the distal descending colon for the  anastomosis.   We placed noncrushing bowel clamps on the proximal portion of the colon.  Lembert stitches were placed at the corners and then subsequently on the  back wall at the open ends of both the distal and the proximal colon.  We  then placed running interlocking stitches of 3-0 Vicryl on the back wall,  through-and-through stitches, and then brought it out anteriorly  using  Connell stitch.  Once the [facility] stitch was completed, we reanastomosed the  anterior wall of the colon using interrupted Lembert stitches of 3-0 silk.  Once this was done, the mesentery was closed using 3-0 silk.  We irrigated  with saline, found there to be minimal bleeding from the mesentery or the  lateral wall.   The anterior abdominal wall stomal site was closed using internal and  external interrupted figure-of-eight  stitches of #1 Vicryl.  We irrigated  with saline.  Then we closed the fascia after changing our gloves to clean  gloves using a running #1 PDS suture.  The skin was closed using stainless  steel staples, and so was the site of the colostomy.  All needle counts,  sponge counts, an instrument counts were correct.                                               [name], M.D.    JW/MEDQ  D:  07/27/2002  T:  07/27/2002  Job:  [id number]

## 2011-07-04 LAB — COMPREHENSIVE METABOLIC PANEL
Alkaline Phosphatase: 69
BUN: 18
Chloride: 94 — ABNORMAL LOW
Glucose, Bld: 694
Potassium: 4.4
Total Bilirubin: 0.9
Total Protein: 6.7

## 2011-07-04 LAB — POCT I-STAT, CHEM 8
Glucose, Bld: 677
HCT: 45
Hemoglobin: 15.3
Potassium: 4.4
Sodium: 129 — ABNORMAL LOW

## 2011-07-04 LAB — LIPID PANEL
Total CHOL/HDL Ratio: 6.1
VLDL: 38

## 2011-07-04 LAB — HEMOGLOBIN A1C: Mean Plasma Glucose: 403

## 2011-07-04 LAB — BASIC METABOLIC PANEL
BUN: 10
BUN: 11
CO2: 27
Calcium: 8.7
Chloride: 110
Creatinine, Ser: 1.02
Creatinine, Ser: 1.02
GFR calc non Af Amer: >60
Glucose, Bld: 109 — ABNORMAL HIGH

## 2011-07-04 LAB — CARDIAC PANEL(CRET KIN+CKTOT+MB+TROPI)
CK, MB: 1.9
CK, MB: 2.3
Relative Index: 1.6
Relative Index: 1.9
Troponin I: 0.03

## 2011-07-04 LAB — URINALYSIS, ROUTINE W REFLEX MICROSCOPIC
Bilirubin Urine: NEGATIVE
Hgb urine dipstick: NEGATIVE
Ketones, ur: 15 — AB
Nitrite: NEGATIVE
pH: 5.5

## 2011-07-04 LAB — CBC
MCHC: 33.6
MCV: 78.9
MCV: 80.1
Platelets: ADEQUATE
RBC: 5.22
RDW: 13.7
WBC: 5.4

## 2011-07-04 LAB — D-DIMER, QUANTITATIVE

## 2011-07-04 LAB — POTASSIUM: Potassium: 3.8

## 2011-07-04 LAB — CK TOTAL AND CKMB (NOT AT ARMC): Total CK: 117

## 2011-07-04 LAB — PROTIME-INR: Prothrombin Time: 12.8

## 2011-07-04 LAB — DIFFERENTIAL
Basophils Relative: 0
Eosinophils Absolute: 0.1
Neutrophils Relative %: 62

## 2011-07-04 LAB — URINE MICROSCOPIC-ADD ON

## 2012-03-25 ENCOUNTER — Ambulatory Visit
Admission: RE | Admit: 2012-03-25 | Discharge: 2012-03-25 | Disposition: A | Discharge status: Home / Self Care | Payer: Medicare Other | Source: Ambulatory Visit | Attending: Cardiology | Admitting: Cardiology

## 2012-03-25 ENCOUNTER — Other Ambulatory Visit: Payer: Self-pay | Admitting: Cardiology

## 2012-03-25 DIAGNOSIS — M542 Cervicalgia: Secondary | ICD-10-CM

## 2012-11-26 ENCOUNTER — Other Ambulatory Visit (HOSPITAL_COMMUNITY): Payer: Self-pay | Admitting: Cardiology

## 2012-11-26 DIAGNOSIS — R079 Chest pain, unspecified: Secondary | ICD-10-CM

## 2012-12-02 ENCOUNTER — Encounter (HOSPITAL_COMMUNITY): Payer: Medicare Other

## 2012-12-02 ENCOUNTER — Encounter (HOSPITAL_COMMUNITY)
Admission: RE | Admit: 2012-12-02 | Discharge: 2012-12-02 | Disposition: A | Discharge status: Home / Self Care | Payer: Medicare Other | Source: Ambulatory Visit | Attending: Cardiology | Admitting: Cardiology

## 2012-12-02 DIAGNOSIS — R079 Chest pain, unspecified: Secondary | ICD-10-CM

## 2012-12-11 ENCOUNTER — Encounter (HOSPITAL_COMMUNITY)
Admission: RE | Admit: 2012-12-11 | Discharge: 2012-12-11 | Disposition: A | Discharge status: Home / Self Care | Payer: Medicare Other | Source: Ambulatory Visit | Attending: Cardiology | Admitting: Cardiology

## 2012-12-11 ENCOUNTER — Encounter (HOSPITAL_COMMUNITY): Payer: Self-pay

## 2012-12-11 DIAGNOSIS — E785 Hyperlipidemia, unspecified: Secondary | ICD-10-CM | POA: Insufficient documentation

## 2012-12-11 DIAGNOSIS — R079 Chest pain, unspecified: Secondary | ICD-10-CM | POA: Insufficient documentation

## 2012-12-11 DIAGNOSIS — E119 Type 2 diabetes mellitus without complications: Secondary | ICD-10-CM | POA: Insufficient documentation

## 2012-12-11 DIAGNOSIS — I10 Essential (primary) hypertension: Secondary | ICD-10-CM | POA: Insufficient documentation

## 2012-12-11 HISTORY — DX: Hyperlipidemia, unspecified: E78.5

## 2012-12-11 HISTORY — DX: Type 2 diabetes mellitus without complications: E11.9

## 2012-12-11 HISTORY — DX: Essential (primary) hypertension: I10

## 2012-12-11 MED ORDER — REGADENOSON 0.4 MG/5ML IV SOLN
INTRAVENOUS | Status: AC
  Filled 2012-12-11: qty 5

## 2012-12-11 MED ORDER — TECHNETIUM TC 99M SESTAMIBI GENERIC - CARDIOLITE
10.0000 | Freq: Once | INTRAVENOUS | Status: AC | PRN
  Administered 2012-12-11: 10 via INTRAVENOUS

## 2012-12-11 MED ORDER — REGADENOSON 0.4 MG/5ML IV SOLN
0.4000 mg | Freq: Once | INTRAVENOUS | Status: AC
  Administered 2012-12-11: 0.4 mg via INTRAVENOUS

## 2012-12-11 MED ORDER — TECHNETIUM TC 99M SESTAMIBI - CARDIOLITE
30.0000 | Freq: Once | INTRAVENOUS | Status: AC | PRN
  Administered 2012-12-11: 10:00:00 30 via INTRAVENOUS

## 2013-06-30 ENCOUNTER — Inpatient Hospital Stay (HOSPITAL_COMMUNITY): Payer: Medicare HMO

## 2013-06-30 ENCOUNTER — Encounter (HOSPITAL_COMMUNITY): Payer: Self-pay | Admitting: Emergency Medicine

## 2013-06-30 ENCOUNTER — Inpatient Hospital Stay (HOSPITAL_COMMUNITY)
Admission: EM | Admit: 2013-06-30 | Discharge: 2013-07-04 | DRG: 417 | Disposition: A | Discharge status: Home / Self Care | Payer: Medicare HMO | Attending: Cardiology | Admitting: Cardiology

## 2013-06-30 DIAGNOSIS — E7800 Pure hypercholesterolemia, unspecified: Secondary | ICD-10-CM | POA: Diagnosis present

## 2013-06-30 DIAGNOSIS — K8020 Calculus of gallbladder without cholecystitis without obstruction: Secondary | ICD-10-CM | POA: Diagnosis present

## 2013-06-30 DIAGNOSIS — I498 Other specified cardiac arrhythmias: Secondary | ICD-10-CM | POA: Diagnosis present

## 2013-06-30 DIAGNOSIS — E119 Type 2 diabetes mellitus without complications: Secondary | ICD-10-CM | POA: Diagnosis present

## 2013-06-30 DIAGNOSIS — K811 Chronic cholecystitis: Principal | ICD-10-CM | POA: Diagnosis present

## 2013-06-30 DIAGNOSIS — K812 Acute cholecystitis with chronic cholecystitis: Secondary | ICD-10-CM

## 2013-06-30 DIAGNOSIS — Z794 Long term (current) use of insulin: Secondary | ICD-10-CM

## 2013-06-30 DIAGNOSIS — E785 Hyperlipidemia, unspecified: Secondary | ICD-10-CM | POA: Diagnosis present

## 2013-06-30 DIAGNOSIS — R0789 Other chest pain: Secondary | ICD-10-CM | POA: Diagnosis present

## 2013-06-30 DIAGNOSIS — I10 Essential (primary) hypertension: Secondary | ICD-10-CM | POA: Diagnosis present

## 2013-06-30 DIAGNOSIS — Z87891 Personal history of nicotine dependence: Secondary | ICD-10-CM

## 2013-06-30 DIAGNOSIS — K859 Acute pancreatitis without necrosis or infection, unspecified: Secondary | ICD-10-CM | POA: Diagnosis present

## 2013-06-30 DIAGNOSIS — K851 Biliary acute pancreatitis without necrosis or infection: Secondary | ICD-10-CM | POA: Diagnosis present

## 2013-06-30 LAB — CBC
HCT: 37.9 % — ABNORMAL LOW (ref 39.0–52.0)
RDW: 12.9 % (ref 11.5–15.5)
WBC: 5 10*3/uL (ref 4.0–10.5)

## 2013-06-30 LAB — COMPREHENSIVE METABOLIC PANEL
ALT: 135 U/L — ABNORMAL HIGH (ref 0–53)
AST: 75 U/L — ABNORMAL HIGH (ref 0–37)
Albumin: 4.1 g/dL (ref 3.5–5.2)
Alkaline Phosphatase: 157 U/L — ABNORMAL HIGH (ref 39–117)
BUN: 15 mg/dL (ref 6–23)
Chloride: 97 mEq/L (ref 96–112)
Potassium: 3.7 mEq/L (ref 3.5–5.1)
Sodium: 137 mEq/L (ref 135–145)
Total Bilirubin: 1.5 mg/dL — ABNORMAL HIGH (ref 0.3–1.2)

## 2013-06-30 LAB — GLUCOSE, CAPILLARY: Glucose-Capillary: 199 mg/dL — ABNORMAL HIGH (ref 70–99)

## 2013-06-30 MED ORDER — HEPARIN SODIUM (PORCINE) 5000 UNIT/ML IJ SOLN
5000.0000 [IU] | Freq: Three times a day (TID) | INTRAMUSCULAR | Status: DC
  Administered 2013-06-30 – 2013-07-02 (×5): 5000 [IU] via SUBCUTANEOUS
  Filled 2013-06-30 (×8): qty 1

## 2013-06-30 MED ORDER — ASPIRIN 81 MG PO CHEW: 324.0000 mg | CHEWABLE_TABLET | ORAL | Status: AC

## 2013-06-30 MED ORDER — LOSARTAN POTASSIUM 50 MG PO TABS
50.0000 mg | ORAL_TABLET | Freq: Every day | ORAL | Status: DC
  Administered 2013-07-01 – 2013-07-04 (×3): 50 mg via ORAL
  Filled 2013-06-30 (×4): qty 1

## 2013-06-30 MED ORDER — NITROGLYCERIN 0.4 MG SL SUBL: 0.4000 mg | SUBLINGUAL_TABLET | SUBLINGUAL | Status: DC | PRN

## 2013-06-30 MED ORDER — INSULIN ASPART 100 UNIT/ML ~~LOC~~ SOLN
0.0000 [IU] | Freq: Three times a day (TID) | SUBCUTANEOUS | Status: DC
  Administered 2013-07-01: 3 [IU] via SUBCUTANEOUS
  Administered 2013-07-01: 2 [IU] via SUBCUTANEOUS
  Administered 2013-07-01: 3 [IU] via SUBCUTANEOUS
  Administered 2013-07-02 – 2013-07-03 (×2): 2 [IU] via SUBCUTANEOUS
  Administered 2013-07-03 – 2013-07-04 (×2): 5 [IU] via SUBCUTANEOUS

## 2013-06-30 MED ORDER — PANTOPRAZOLE SODIUM 40 MG PO TBEC
40.0000 mg | DELAYED_RELEASE_TABLET | Freq: Every day | ORAL | Status: DC
  Administered 2013-06-30 – 2013-07-04 (×4): 40 mg via ORAL
  Filled 2013-06-30 (×5): qty 1

## 2013-06-30 MED ORDER — ACETAMINOPHEN 325 MG PO TABS
650.0000 mg | ORAL_TABLET | ORAL | Status: DC | PRN
  Administered 2013-07-01 (×3): 650 mg via ORAL
  Filled 2013-06-30 (×3): qty 2

## 2013-06-30 MED ORDER — ASPIRIN 81 MG PO CHEW
324.0000 mg | CHEWABLE_TABLET | Freq: Once | ORAL | Status: AC
  Administered 2013-06-30: 324 mg via ORAL

## 2013-06-30 MED ORDER — PNEUMOCOCCAL VAC POLYVALENT 25 MCG/0.5ML IJ INJ
0.5000 mL | INJECTION | INTRAMUSCULAR | Status: AC
  Administered 2013-07-01: 0.5 mL via INTRAMUSCULAR
  Filled 2013-06-30: qty 0.5

## 2013-06-30 MED ORDER — HEPARIN SODIUM (PORCINE) 5000 UNIT/ML IJ SOLN
4000.0000 [IU] | Freq: Once | INTRAMUSCULAR | Status: AC
  Administered 2013-06-30: 4000 [IU] via INTRAVENOUS
  Filled 2013-06-30: qty 1

## 2013-06-30 MED ORDER — ASPIRIN 300 MG RE SUPP
300.0000 mg | RECTAL | Status: AC
  Filled 2013-06-30: qty 1

## 2013-06-30 MED ORDER — ONDANSETRON HCL 4 MG/2ML IJ SOLN
4.0000 mg | Freq: Four times a day (QID) | INTRAMUSCULAR | Status: DC | PRN
  Administered 2013-07-02: 4 mg via INTRAVENOUS
  Filled 2013-06-30: qty 2

## 2013-06-30 MED ORDER — ASPIRIN 81 MG PO CHEW
CHEWABLE_TABLET | ORAL | Status: AC
  Filled 2013-06-30: qty 4

## 2013-06-30 MED ORDER — SODIUM CHLORIDE 0.9 % IV SOLN
INTRAVENOUS | Status: DC
  Administered 2013-06-30 – 2013-07-03 (×4): via INTRAVENOUS

## 2013-06-30 MED ORDER — INSULIN GLARGINE 100 UNIT/ML SOLOSTAR PEN: 10.0000 [IU] | PEN_INJECTOR | Freq: Every day | SUBCUTANEOUS | Status: DC

## 2013-06-30 MED ORDER — ATORVASTATIN CALCIUM 20 MG PO TABS
20.0000 mg | ORAL_TABLET | Freq: Every day | ORAL | Status: DC
  Administered 2013-07-01 – 2013-07-04 (×3): 20 mg via ORAL
  Filled 2013-06-30 (×4): qty 1

## 2013-06-30 MED ORDER — INSULIN GLARGINE 100 UNIT/ML ~~LOC~~ SOLN
10.0000 [IU] | Freq: Every day | SUBCUTANEOUS | Status: DC
  Administered 2013-06-30 – 2013-07-03 (×4): 10 [IU] via SUBCUTANEOUS
  Filled 2013-06-30 (×5): qty 0.1

## 2013-06-30 MED ORDER — ASPIRIN EC 81 MG PO TBEC
81.0000 mg | DELAYED_RELEASE_TABLET | Freq: Every day | ORAL | Status: DC
  Administered 2013-07-01 – 2013-07-04 (×3): 81 mg via ORAL
  Filled 2013-06-30 (×4): qty 1

## 2013-06-30 MED ORDER — LEVOTHYROXINE SODIUM 100 MCG PO TABS
100.0000 ug | ORAL_TABLET | Freq: Every day | ORAL | Status: DC
  Administered 2013-07-01 – 2013-07-04 (×4): 100 ug via ORAL
  Filled 2013-06-30 (×5): qty 1
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
## 2013-06-30 NOTE — ED Notes (Signed)
Pt c/o epigastric CP x 2 weeks worse today

## 2013-06-30 NOTE — ED Notes (Signed)
Pt taken from ultrasound to admission bed by ED tech

## 2013-06-30 NOTE — ED Notes (Signed)
Pt is to have ultrasound completed before being transported upstairs per MD Rhunette Croft

## 2013-06-30 NOTE — ED Notes (Signed)
Cardiology at bedside, code STEMI canceled

## 2013-07-01 ENCOUNTER — Other Ambulatory Visit: Payer: Self-pay

## 2013-07-01 ENCOUNTER — Encounter (HOSPITAL_COMMUNITY): Payer: Self-pay | Admitting: General Surgery

## 2013-07-01 DIAGNOSIS — I2510 Atherosclerotic heart disease of native coronary artery without angina pectoris: Secondary | ICD-10-CM

## 2013-07-01 DIAGNOSIS — E119 Type 2 diabetes mellitus without complications: Secondary | ICD-10-CM

## 2013-07-01 DIAGNOSIS — K851 Biliary acute pancreatitis without necrosis or infection: Secondary | ICD-10-CM | POA: Diagnosis present

## 2013-07-01 DIAGNOSIS — K859 Acute pancreatitis without necrosis or infection, unspecified: Secondary | ICD-10-CM

## 2013-07-01 DIAGNOSIS — K8020 Calculus of gallbladder without cholecystitis without obstruction: Secondary | ICD-10-CM | POA: Diagnosis present

## 2013-07-01 LAB — CBC WITH DIFFERENTIAL/PLATELET
Basophils Absolute: 0 10*3/uL (ref 0.0–0.1)
Basophils Relative: 1 % (ref 0–1)
Eosinophils Absolute: 0.2 10*3/uL (ref 0.0–0.7)
Eosinophils Relative: 5 % (ref 0–5)
Lymphs Abs: 1.1 10*3/uL (ref 0.7–4.0)
MCH: 26.9 pg (ref 26.0–34.0)
MCHC: 35.5 g/dL (ref 30.0–36.0)
Monocytes Relative: 13 % — ABNORMAL HIGH (ref 3–12)
Neutro Abs: 2.2 10*3/uL (ref 1.7–7.7)
Neutrophils Relative %: 54 % (ref 43–77)
RBC: 4.64 MIL/uL (ref 4.22–5.81)
RDW: 12.9 % (ref 11.5–15.5)

## 2013-07-01 LAB — COMPREHENSIVE METABOLIC PANEL
ALT: 116 U/L — ABNORMAL HIGH (ref 0–53)
Albumin: 3.6 g/dL (ref 3.5–5.2)
Alkaline Phosphatase: 141 U/L — ABNORMAL HIGH (ref 39–117)
BUN: 15 mg/dL (ref 6–23)
Calcium: 9.2 mg/dL (ref 8.4–10.5)
Chloride: 99 mEq/L (ref 96–112)
GFR calc Af Amer: 81 mL/min — ABNORMAL LOW (ref >90)
GFR calc non Af Amer: 70 mL/min — ABNORMAL LOW (ref >90)
Glucose, Bld: 273 mg/dL — ABNORMAL HIGH (ref 70–99)
Potassium: 3.4 mEq/L — ABNORMAL LOW (ref 3.5–5.1)
Sodium: 139 mEq/L (ref 135–145)
Total Protein: 7 g/dL (ref 6.0–8.3)

## 2013-07-01 LAB — LIPID PANEL
Cholesterol: 122 mg/dL (ref 0–200)
Total CHOL/HDL Ratio: 8.7 RATIO

## 2013-07-01 LAB — TROPONIN I
Troponin I: <0.3 ng/mL (ref <0.30)
Troponin I: <0.3 ng/mL (ref <0.30)
Troponin I: <0.3 ng/mL (ref <0.30)

## 2013-07-01 LAB — GLUCOSE, CAPILLARY
Glucose-Capillary: 226 mg/dL — ABNORMAL HIGH (ref 70–99)
Glucose-Capillary: 230 mg/dL — ABNORMAL HIGH (ref 70–99)
Glucose-Capillary: 272 mg/dL — ABNORMAL HIGH (ref 70–99)

## 2013-07-01 LAB — CBC
MCV: 76 fL — ABNORMAL LOW (ref 78.0–100.0)
Platelets: 207 10*3/uL (ref 150–400)
RBC: 4.75 MIL/uL (ref 4.22–5.81)
RDW: 13 % (ref 11.5–15.5)
WBC: 5 10*3/uL (ref 4.0–10.5)

## 2013-07-01 LAB — AMYLASE: Amylase: 141 U/L — ABNORMAL HIGH (ref 0–105)

## 2013-07-01 LAB — HEMOGLOBIN A1C: Mean Plasma Glucose: 226 mg/dL — ABNORMAL HIGH (ref <117)

## 2013-07-01 LAB — BASIC METABOLIC PANEL
CO2: 27 mEq/L (ref 19–32)
Chloride: 105 mEq/L (ref 96–112)
Creatinine, Ser: 1.22 mg/dL (ref 0.50–1.35)
GFR calc Af Amer: 73 mL/min — ABNORMAL LOW (ref >90)
Glucose, Bld: 266 mg/dL — ABNORMAL HIGH (ref 70–99)
Potassium: 4.3 mEq/L (ref 3.5–5.1)
Sodium: 144 mEq/L (ref 135–145)

## 2013-07-01 LAB — LIPASE, BLOOD: Lipase: 179 U/L — ABNORMAL HIGH (ref 11–59)

## 2013-07-01 LAB — PROTIME-INR
INR: 0.95 (ref 0.00–1.49)
Prothrombin Time: 12.5 seconds (ref 11.6–15.2)

## 2013-07-01 LAB — APTT: aPTT: 36 seconds (ref 24–37)

## 2013-07-01 LAB — MAGNESIUM: Magnesium: 1.9 mg/dL (ref 1.5–2.5)

## 2013-07-01 MED ORDER — PIPERACILLIN-TAZOBACTAM 3.375 G IVPB
3.3750 g | Freq: Three times a day (TID) | INTRAVENOUS | Status: DC
  Administered 2013-07-01 – 2013-07-02 (×3): 3.375 g via INTRAVENOUS
  Filled 2013-07-01 (×6): qty 50

## 2013-07-01 MED ORDER — MORPHINE SULFATE 2 MG/ML IJ SOLN: 2.0000 mg | INTRAMUSCULAR | Status: DC | PRN

## 2013-07-01 MED ORDER — ONDANSETRON HCL 4 MG/2ML IJ SOLN
4.0000 mg | Freq: Four times a day (QID) | INTRAMUSCULAR | Status: DC
  Administered 2013-07-01 – 2013-07-02 (×2): 4 mg via INTRAVENOUS
  Filled 2013-07-01 (×2): qty 2

## 2013-07-01 NOTE — Progress Notes (Signed)
Utilization review completed.  

## 2013-07-01 NOTE — Consult Note (Signed)
Reason for Consult: gallstone pancreatitis, cholelithiasis  Referring Physician: Dr. Derwood Kaplan  HPI [name] is a 62 y.o. 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  He presented with [facility] with a 5 day history of abdominal pain.  Onset was sudden after he ate fried salmon for dinner.  Location of pain; epigastric region without radiation.  Characterized as pressure.  The pain did not resolve after 5 days and was finally convinced by his wife to come to the ED.  Time pattern; constant.  Aggravated by oral intake, supine position.  He denies increase in pain with ambulating, denies shortness of breath.  No alleviating factors.  Associated with malaise, nausea, sweats. Modifying factors include; miralax, tums and inducing vomiting.  He did not feel better after having a BM and temporarily felt better after vomiting.  He denies fever, chills.  Previous symptoms for the past 3 years, about 1 per month depending on what he eats.  He attributed his previous symptoms to "gas."  Denies personal of family history of colon cancer, UC, Crohn's.  He denies recent weight loss.  He stopped consuming alcohol 10 years ago.  He has a 105 pack year history, stopped smoking 10 years ago.  He had a negative stress test in March.  EKG in ED initially noted ST changes, negative cardiac enzymes.  At present time, his pain is 4/10 remains in epigastric region.   Past Medical History  Diagnosis Date  . DM (diabetes mellitus)   . HTN (hypertension)   . Hyperlipemia     Past Surgical History  Procedure Laterality Date  . Colon surgery    . Diverting colostomy and i&d fourniers  04/10/02    Dr. Jimmye Norman  . Skin graft  05/08/02    Dr. Odis Luster  . Reversal of colostomy  07/27/02     Dr. Jimmye Norman    Family History  Problem  Relation Age of Onset  . COPD Mother   . Lung cancer Father   . Diabetes Paternal Uncle   . Hearing loss Paternal Uncle     Social History:  reports that he quit smoking about 9 years ago. His smoking use included Cigarettes. He smoked 3.00 packs per day. He has never used smokeless tobacco. He reports that he does not drink alcohol or use illicit drugs. 105 pack year history.  No alcohol in >10 years.   Allergies: No Known Allergies  Medications: {medication reviewed  Results for orders placed during the hospital encounter of 06/30/13 (from the past 48 hour(s))  CBC     Status: Abnormal   Collection Time    06/30/13  7:21 PM      Result Value Range   WBC 5.0  4.0 - 10.5 K/uL   RBC 5.03  4.22 - 5.81 MIL/uL   Hemoglobin 13.6  13.0 - 17.0 g/dL   HCT 16.1 (*) 09.6 - 04.5 %   MCV 75.3 (*) 78.0 - 100.0 fL   MCH 27.0  26.0 - 34.0 pg   MCHC 35.9  30.0 - 36.0 g/dL   RDW 40.9  81.1 - 91.4 %   Platelets 207  150 - 400 K/uL  COMPREHENSIVE METABOLIC PANEL     Status: Abnormal   Collection Time    06/30/13  7:21 PM      Result Value Range  Sodium 137  135 - 145 mEq/L   Potassium 3.7  3.5 - 5.1 mEq/L   Chloride 97  96 - 112 mEq/L   CO2 28  19 - 32 mEq/L   Glucose, Bld 235 (*) 70 - 99 mg/dL   BUN 15  6 - 23 mg/dL   Creatinine, Ser 1.61  0.50 - 1.35 mg/dL   Calcium 9.9  8.4 - 09.6 mg/dL   Total Protein 8.0  6.0 - 8.3 g/dL   Albumin 4.1  3.5 - 5.2 g/dL   AST 75 (*) 0 - 37 U/L   ALT 135 (*) 0 - 53 U/L   Alkaline Phosphatase 157 (*) 39 - 117 U/L   Total Bilirubin 1.5 (*) 0.3 - 1.2 mg/dL   GFR calc non Af Amer 70 (*) >90 mL/min   GFR calc Af Amer 81 (*) >90 mL/min   Comment: (NOTE)     The eGFR has been calculated using the CKD EPI equation.     This calculation has not been validated in all clinical situations.     eGFR's persistently <90 mL/min signify possible Chronic Kidney     Disease.  LIPASE, BLOOD     Status: Abnormal   Collection Time    06/30/13  7:21 PM      Result  Value Range   Lipase 220 (*) 11 - 59 U/L  POCT I-STAT TROPONIN I     Status: None   Collection Time    06/30/13  7:26 PM      Result Value Range   Troponin i, poc 0.01  0.00 - 0.08 ng/mL   Comment 3            Comment: Due to the release kinetics of cTnI,     a negative result within the first hours     of the onset of symptoms does not rule out     myocardial infarction with certainty.     If myocardial infarction is still suspected,     repeat the test at appropriate intervals.  GLUCOSE, CAPILLARY     Status: Abnormal   Collection Time    06/30/13 10:20 PM      Result Value Range   Glucose-Capillary 199 (*) 70 - 99 mg/dL   Comment 1 Notify RN    TROPONIN I     Status: None   Collection Time    07/01/13 12:32 AM      Result Value Range   Troponin I <0.30  <0.30 ng/mL   Comment:            Due to the release kinetics of cTnI,     a negative result within the first hours     of the onset of symptoms does not rule out     myocardial infarction with certainty.     If myocardial infarction is still suspected,     repeat the test at appropriate intervals.  PROTIME-INR     Status: None   Collection Time    07/01/13 12:32 AM      Result Value Range   Prothrombin Time 12.5  11.6 - 15.2 seconds   INR 0.95  0.00 - 1.49  APTT     Status: None   Collection Time    07/01/13 12:32 AM      Result Value Range   aPTT 36  24 - 37 seconds  CBC WITH DIFFERENTIAL     Status: Abnormal   Collection Time  07/01/13 12:32 AM      Result Value Range   WBC 4.1  4.0 - 10.5 K/uL   RBC 4.64  4.22 - 5.81 MIL/uL   Hemoglobin 12.5 (*) 13.0 - 17.0 g/dL   HCT 46.9 (*) 62.9 - 52.8 %   MCV 75.9 (*) 78.0 - 100.0 fL   MCH 26.9  26.0 - 34.0 pg   MCHC 35.5  30.0 - 36.0 g/dL   RDW 41.3  24.4 - 01.0 %   Platelets 206  150 - 400 K/uL   Neutrophils Relative % 54  43 - 77 %   Neutro Abs 2.2  1.7 - 7.7 K/uL   Lymphocytes Relative 28  12 - 46 %   Lymphs Abs 1.1  0.7 - 4.0 K/uL   Monocytes Relative 13  (*) 3 - 12 %   Monocytes Absolute 0.5  0.1 - 1.0 K/uL   Eosinophils Relative 5  0 - 5 %   Eosinophils Absolute 0.2  0.0 - 0.7 K/uL   Basophils Relative 1  0 - 1 %   Basophils Absolute 0.0  0.0 - 0.1 K/uL  COMPREHENSIVE METABOLIC PANEL     Status: Abnormal   Collection Time    07/01/13 12:32 AM      Result Value Range   Sodium 139  135 - 145 mEq/L   Potassium 3.4 (*) 3.5 - 5.1 mEq/L   Chloride 99  96 - 112 mEq/L   CO2 27  19 - 32 mEq/L   Glucose, Bld 273 (*) 70 - 99 mg/dL   BUN 15  6 - 23 mg/dL   Creatinine, Ser 2.72  0.50 - 1.35 mg/dL   Calcium 9.2  8.4 - 53.6 mg/dL   Total Protein 7.0  6.0 - 8.3 g/dL   Albumin 3.6  3.5 - 5.2 g/dL   AST 64 (*) 0 - 37 U/L   ALT 116 (*) 0 - 53 U/L   Alkaline Phosphatase 141 (*) 39 - 117 U/L   Total Bilirubin 1.3 (*) 0.3 - 1.2 mg/dL   GFR calc non Af Amer 70 (*) >90 mL/min   GFR calc Af Amer 81 (*) >90 mL/min   Comment: (NOTE)     The eGFR has been calculated using the CKD EPI equation.     This calculation has not been validated in all clinical situations.     eGFR's persistently <90 mL/min signify possible Chronic Kidney     Disease.  MAGNESIUM     Status: None   Collection Time    07/01/13 12:32 AM      Result Value Range   Magnesium 1.9  1.5 - 2.5 mg/dL  TROPONIN I     Status: None   Collection Time    07/01/13  6:50 AM      Result Value Range   Troponin I <0.30  <0.30 ng/mL   Comment:            Due to the release kinetics of cTnI,     a negative result within the first hours     of the onset of symptoms does not rule out     myocardial infarction with certainty.     If myocardial infarction is still suspected,     repeat the test at appropriate intervals.  CBC     Status: Abnormal   Collection Time    07/01/13  6:50 AM      Result Value Range   WBC 5.0  4.0 -  10.5 K/uL   RBC 4.75  4.22 - 5.81 MIL/uL   Hemoglobin 12.5 (*) 13.0 - 17.0 g/dL   HCT 16.1 (*) 09.6 - 04.5 %   MCV 76.0 (*) 78.0 - 100.0 fL   MCH 26.3  26.0 - 34.0 pg    MCHC 34.6  30.0 - 36.0 g/dL   RDW 40.9  81.1 - 91.4 %   Platelets 207  150 - 400 K/uL  BASIC METABOLIC PANEL     Status: Abnormal   Collection Time    07/01/13  6:50 AM      Result Value Range   Sodium 144  135 - 145 mEq/L   Potassium 4.3  3.5 - 5.1 mEq/L   Chloride 105  96 - 112 mEq/L   CO2 27  19 - 32 mEq/L   Glucose, Bld 266 (*) 70 - 99 mg/dL   BUN 16  6 - 23 mg/dL   Creatinine, Ser 7.82  0.50 - 1.35 mg/dL   Calcium 9.3  8.4 - 95.6 mg/dL   GFR calc non Af Amer 63 (*) >90 mL/min   GFR calc Af Amer 73 (*) >90 mL/min   Comment: (NOTE)     The eGFR has been calculated using the CKD EPI equation.     This calculation has not been validated in all clinical situations.     eGFR's persistently <90 mL/min signify possible Chronic Kidney     Disease.  AMYLASE     Status: Abnormal   Collection Time    07/01/13  6:50 AM      Result Value Range   Amylase 141 (*) 0 - 105 U/L  LIPASE, BLOOD     Status: Abnormal   Collection Time    07/01/13  6:50 AM      Result Value Range   Lipase 179 (*) 11 - 59 U/L  LIPID PANEL     Status: Abnormal   Collection Time    07/01/13  6:50 AM      Result Value Range   Cholesterol 122  0 - 200 mg/dL   Triglycerides 213  <086 mg/dL   HDL 14 (*) >57 mg/dL   Total CHOL/HDL Ratio 8.7     VLDL 26  0 - 40 mg/dL   LDL Cholesterol 82  0 - 99 mg/dL   Comment:            Total Cholesterol/HDL:CHD Risk     Coronary Heart Disease Risk Table                         Men   Women      1/2 Average Risk   3.4   3.3      Average Risk       5.0   4.4      2 X Average Risk   9.6   7.1      3 X Average Risk  23.4   11.0                Use the calculated Patient Ratio     above and the CHD Risk Table     to determine the patient's CHD Risk.                ATP III CLASSIFICATION (LDL):      <100     mg/dL   Optimal      846-962  mg/dL   Near or Above  Optimal      130-159  mg/dL   Borderline      469-629  mg/dL   High      >528     mg/dL    Very High  GLUCOSE, CAPILLARY     Status: Abnormal   Collection Time    07/01/13  8:08 AM      Result Value Range   Glucose-Capillary 226 (*) 70 - 99 mg/dL   Comment 1 Documented in Chart     Comment 2 Notify RN      US Abdomen Complete  06/30/2013   CLINICAL DATA:  One week history of epigastric abdominal pain. Elevated LFTs and elevated serum lipase currently. Current history of diabetes, hypertension, and hyperlipidemia.  EXAM: COMPLETE ABDOMEN ULTRASOUND  COMPARISON:  None.  FINDINGS: Gallbladder  Large amount of echogenic sludge with probable very small gallstones. Thickened gallbladder wall up to approximately 6 mm. No pericholecystic fluid. Negative sonographic Murphy sign according to the ultrasound technologist.  Common bile duct  Diameter: 6 mm.  Liver  Diffusely increased and coarsened echotexture without focal parenchymal abnormality. Patent portal vein with hepatopetal flow.  IVC  Patent.  Pancreas  Obscured by midline bowel gas.  Spleen  Normal size and echotexture without focal parenchymal abnormality.  Right Kidney  Length: Approximately 11.2 cm. Well-preserved cortex. Normal parenchymal echotexture. No focal parenchymal abnormality. No visible shadowing calculi.  Left Kidney  Length: Approximately 12.1 cm. Well-preserved cortex. Normal parenchymal echotexture. No focal parenchymal abnormality. No visible shadowing calculi.  Abdominal aorta  Normal in caliber through its visualized course the abdomen; obscured proximally and at the bifurcation by overlying bowel gas.  IMPRESSION: 1. Large amount of gallbladder sludge and probable small gallstones. Gallbladder wall thickening is consistent with cholecystitis; in the absence of a sonographic Murphy sign, this is likely chronic cholecystitis. 2. Diffuse hepatic steatosis and/or hepatocellular disease without focal hepatic parenchymal abnormality. 3. Otherwise normal examination with a caveat that the pancreas, the proximal abdominal aorta,  and the distal abdominal aorta at the bifurcation were obscured by overlying bowel gas and were cyber poor therefore not evaluated.   Electronically Signed   By: Hulan Saas   On: 06/30/2013 22:01   Dg Chest Port 1 View  06/30/2013   CLINICAL DATA:  Mid chest pain for 1 week. Previous smoker. History of hypertension and diabetes.  EXAM: PORTABLE CHEST - 1 VIEW  COMPARISON:  02/22/2008.  FINDINGS: 2058 hr. The heart size and mediastinal contours are normal. The lungs are clear. There is no pleural effusion or pneumothorax. No acute osseous findings are identified. Telemetry leads overlie the chest.  IMPRESSION: No active cardiopulmonary process.   Electronically Signed   By: Roxy Horseman   On: 06/30/2013 21:16    Review of Systems  Constitutional: Positive for malaise/fatigue and diaphoresis. Negative for fever, chills and weight loss.  Respiratory: Negative for shortness of breath.   Cardiovascular: Negative for chest pain, palpitations and PND.  Gastrointestinal: Positive for abdominal pain. Negative for nausea, vomiting, diarrhea, constipation, blood in stool and melena.  Genitourinary: Negative for dysuria.  Neurological: Positive for weakness. Negative for dizziness, tingling, focal weakness, loss of consciousness and headaches.  Psychiatric/Behavioral: Negative for depression and substance abuse.   Blood pressure 134/69, pulse 53, temperature 98.1 F (36.7 C), temperature source Oral, resp. rate 18, height 5\' 5"  (1.651 m), weight 190 lb 11.2 oz (86.501 kg), SpO2 98.00%. Physical Exam  Constitutional: He is oriented to person, place, and time. He  appears well-developed and well-nourished. No distress.  HENT:  Head: Normocephalic and atraumatic.  Mouth/Throat: No oropharyngeal exudate.  Neck: Normal range of motion. Neck supple. No JVD present. No thyromegaly present.  Cardiovascular: Normal rate, regular rhythm, normal heart sounds and intact distal pulses.  Exam reveals no gallop and  no friction rub.   No murmur heard. Respiratory: Effort normal and breath sounds normal. No respiratory distress. He has no wheezes. He has no rales.  GI: Soft. Bowel sounds are normal. He exhibits no distension and no mass. There is no rebound and no guarding.  TTP epigastric region, RUQ, negative Murphy's sign, no LUQ tenderness.   Multiple infraumbilical surgery scars  Musculoskeletal: He exhibits no edema and no tenderness.  Lymphadenopathy:    He has no cervical adenopathy.  Neurological: He is alert and oriented to person, place, and time. No cranial nerve deficit.  Skin: Skin is warm and dry. No rash noted. He is not diaphoretic. No erythema. No pallor.  Psychiatric: He has a normal mood and affect. His behavior is normal. Judgment and thought content normal.    Assessment/Plan: CAD DM II Hx fournier's gangrene, diverting colostomy reversed in 2003 HTN HLD Cholelithiasis Gallstone Pancreatitis  Chronic cholecystitis   Plan: given ultrasound findings and pancreatitis he would benefit from a cholecystectomy.  Prior to surgery, we will need cardiac clearance.  Due to his previous abdominal surgeries, duration of symptoms, radiologic findings he may require an open cholecystectomy if unable to safely perform laparoscopically.  We discussed this with the patient and wife Talbert Forest.  He may have clears if he is able to tolerate and NPO after midnight.  Start IV atbx, IV fluids, pain control, anti-emetics.  Repeat CBC, CMP and Lipase in AM.     We will await cardiac clearance before proceeding with surgery. Dr. Tawana Scale note to follow.  Thank you for the consult.  Ashok Norris ANP-BC Pager [phone]  07/01/2013, 11:15 AM

## 2013-07-01 NOTE — Progress Notes (Signed)
Inpatient Diabetes Program Recommendations  AACE/ADA: New Consensus Statement on Inpatient Glycemic Control (2013)  Target Ranges:  Prepandial:   less than 140 mg/dL      Peak postprandial:   less than 180 mg/dL (1-2 hours)      Critically ill patients:  140 - 180 mg/dL  Results for JONLUKE, COBBINS (MRN [id number]) as of 07/01/2013 09:36  Ref. Range 06/30/2013 22:20 07/01/2013 08:08  Glucose-Capillary Latest Range: 70-99 mg/dL 119 (H) 147 (H)   Inpatient Diabetes Program Recommendations Insulin - Basal: consider increasing Lantus to home dose 30 units  Thank you  Piedad Climes BSN, RN,CDE Inpatient Diabetes Coordinator [phone] (team pager)

## 2013-07-01 NOTE — Progress Notes (Signed)
Chaplain responded to Code STEMI page for pt in [facility]. Visited with pt and his wife, providing emotional and spiritual support. Explained that I would support pt's wife while pt was in cath lab. Short time later Code STEMI was cancelled.
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
## 2013-07-01 NOTE — Consult Note (Signed)
I saw the patient, participated in the history, exam and medical decision making, and concur with the physician assistant's note above.  Alert, no apparent distress, resting comfortably Clear to auscultation Regular rate and rhythm Abdomen-soft, nondistended, obese. Well-healed midline incision and left lower quarter of ostomy site. Mild epigastric tenderness. No rebound, guarding or peritonitis  Mild gallstone pancreatitis Chronic cholecystitis  I believe the mild elevation in lipase as well as LFTs is because the patient paased a gallstone.  I agree with IV antibiotics. He can have clear liquids this evening. N.p.o. After midnight. Repeat CBC, lipase, LFTs for the morning. If his labs are continuing to normalize will plan laparoscopic cholecystectomy with cholangiogram for Thursday  Patient and wife agreeable with plan  Mary Sella. Andrey Campanile, MD, FACS General, Bariatric, & Minimally Invasive Surgery [facility], [place]

## 2013-07-01 NOTE — Progress Notes (Signed)
Subjective:  Appreciate surgery consult. Patient denies any chest pain or shortness of breath. States abdominal pain is improved. Patient noted to have cholelithiasis with evidence of chronic cholecystitis. Patient states he is very active and denies any exertional chest pains. Patient had nuclear stress test few months ago which was negative for ischemia. Denies any palpitation lightheadedness or syncopal episode. Denies history of PND orthopnea leg swelling.  Objective:  Vital Signs in the last 24 hours: Temp:  [97.6 F (36.4 C)-99 F (37.2 C)] 98.1 F (36.7 C) (09/24 0646) Pulse Rate:  [50-54] 53 (09/24 0646) Resp:  [10-20] 18 (09/24 0646) BP: (134-152)/(45-82) 134/69 mmHg (09/24 0646) SpO2:  [98 %-100 %] 98 % (09/24 0646) Weight:  [86.501 kg (190 lb 11.2 oz)-88.905 kg (196 lb)] 86.501 kg (190 lb 11.2 oz) (09/23 2210)  Intake/Output from previous day: 09/23 0701 - 09/24 0700 In: -  Out: 1200 [Urine:1200] Intake/Output from this shift: Total I/O In: 0  Out: 350 [Urine:350]  Physical Exam: Neck: no adenopathy, no carotid bruit, no JVD and supple, symmetrical, trachea midline Lungs: clear to auscultation bilaterally Heart: regular rate and rhythm, S1, S2 normal and Soft systolic murmur noted no S3 gallop Abdomen: Soft bowel sounds present mild epigastric tenderness Extremities: extremities normal, atraumatic, no cyanosis or edema  Lab Results:  Recent Labs  07/01/13 0032 07/01/13 0650  WBC 4.1 5.0  HGB 12.5* 12.5*  PLT 206 207    Recent Labs  07/01/13 0032 07/01/13 0650  NA 139 144  K 3.4* 4.3  CL 99 105  CO2 27 27  GLUCOSE 273* 266*  BUN 15 16  CREATININE 1.12 1.22    Recent Labs  07/01/13 0032 07/01/13 0650  TROPONINI <0.30 <0.30   Hepatic Function Panel  Recent Labs  07/01/13 0032  PROT 7.0  ALBUMIN 3.6  AST 64*  ALT 116*  ALKPHOS 141*  BILITOT 1.3*    Recent Labs  07/01/13 0650  CHOL 122   No results found for this basename: PROTIME,   in the last 72 hours  Imaging: Imaging results have been reviewed and US Abdomen Complete  06/30/2013   CLINICAL DATA:  One week history of epigastric abdominal pain. Elevated LFTs and elevated serum lipase currently. Current history of diabetes, hypertension, and hyperlipidemia.  EXAM: COMPLETE ABDOMEN ULTRASOUND  COMPARISON:  None.  FINDINGS: Gallbladder  Large amount of echogenic sludge with probable very small gallstones. Thickened gallbladder wall up to approximately 6 mm. No pericholecystic fluid. Negative sonographic Murphy sign according to the ultrasound technologist.  Common bile duct  Diameter: 6 mm.  Liver  Diffusely increased and coarsened echotexture without focal parenchymal abnormality. Patent portal vein with hepatopetal flow.  IVC  Patent.  Pancreas  Obscured by midline bowel gas.  Spleen  Normal size and echotexture without focal parenchymal abnormality.  Right Kidney  Length: Approximately 11.2 cm. Well-preserved cortex. Normal parenchymal echotexture. No focal parenchymal abnormality. No visible shadowing calculi.  Left Kidney  Length: Approximately 12.1 cm. Well-preserved cortex. Normal parenchymal echotexture. No focal parenchymal abnormality. No visible shadowing calculi.  Abdominal aorta  Normal in caliber through its visualized course the abdomen; obscured proximally and at the bifurcation by overlying bowel gas.  IMPRESSION: 1. Large amount of gallbladder sludge and probable small gallstones. Gallbladder wall thickening is consistent with cholecystitis; in the absence of a sonographic Murphy sign, this is likely chronic cholecystitis. 2. Diffuse hepatic steatosis and/or hepatocellular disease without focal hepatic parenchymal abnormality. 3. Otherwise normal examination with a caveat that  the pancreas, the proximal abdominal aorta, and the distal abdominal aorta at the bifurcation were obscured by overlying bowel gas and were cyber poor therefore not evaluated.   Electronically Signed    By: Hulan Saas   On: 06/30/2013 22:01   Dg Chest Port 1 View  06/30/2013   CLINICAL DATA:  Mid chest pain for 1 week. Previous smoker. History of hypertension and diabetes.  EXAM: PORTABLE CHEST - 1 VIEW  COMPARISON:  02/22/2008.  FINDINGS: 2058 hr. The heart size and mediastinal contours are normal. The lungs are clear. There is no pleural effusion or pneumothorax. No acute osseous findings are identified. Telemetry leads overlie the chest.  IMPRESSION: No active cardiopulmonary process.   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  LOS: 1 day    Alaze Garverick N 07/01/2013, 12:44 PM

## 2013-07-02 ENCOUNTER — Encounter (HOSPITAL_COMMUNITY): Payer: Self-pay | Admitting: Certified Registered Nurse Anesthetist

## 2013-07-02 ENCOUNTER — Inpatient Hospital Stay (HOSPITAL_COMMUNITY): Payer: Medicare HMO

## 2013-07-02 ENCOUNTER — Inpatient Hospital Stay (HOSPITAL_COMMUNITY): Payer: Medicare HMO | Admitting: Certified Registered Nurse Anesthetist

## 2013-07-02 ENCOUNTER — Encounter (HOSPITAL_COMMUNITY)
Admission: EM | Disposition: A | Discharge status: Home / Self Care | Payer: Self-pay | Source: Home / Self Care | Attending: Cardiology

## 2013-07-02 DIAGNOSIS — K810 Acute cholecystitis: Secondary | ICD-10-CM

## 2013-07-02 HISTORY — PX: CHOLECYSTECTOMY: SHX55

## 2013-07-02 LAB — CBC
Hemoglobin: 12 g/dL — ABNORMAL LOW (ref 13.0–17.0)
MCV: 75.8 fL — ABNORMAL LOW (ref 78.0–100.0)
Platelets: 209 10*3/uL (ref 150–400)
RBC: 4.5 MIL/uL (ref 4.22–5.81)
WBC: 5.2 10*3/uL (ref 4.0–10.5)

## 2013-07-02 LAB — COMPREHENSIVE METABOLIC PANEL
ALT: 89 U/L — ABNORMAL HIGH (ref 0–53)
AST: 46 U/L — ABNORMAL HIGH (ref 0–37)
Albumin: 3.2 g/dL — ABNORMAL LOW (ref 3.5–5.2)
CO2: 29 mEq/L (ref 19–32)
Calcium: 9.2 mg/dL (ref 8.4–10.5)
Chloride: 104 mEq/L (ref 96–112)
GFR calc non Af Amer: 62 mL/min — ABNORMAL LOW (ref >90)
Sodium: 141 mEq/L (ref 135–145)
Total Bilirubin: 1.1 mg/dL (ref 0.3–1.2)

## 2013-07-02 LAB — GLUCOSE, CAPILLARY
Glucose-Capillary: 181 mg/dL — ABNORMAL HIGH (ref 70–99)
Glucose-Capillary: 157 mg/dL — ABNORMAL HIGH (ref 70–99)

## 2013-07-02 LAB — MRSA PCR SCREENING: MRSA by PCR: NEGATIVE

## 2013-07-02 SURGERY — LAPAROSCOPIC CHOLECYSTECTOMY WITH INTRAOPERATIVE CHOLANGIOGRAM
Anesthesia: General | Site: Abdomen | Wound class: Clean Contaminated

## 2013-07-02 MED ORDER — ROCURONIUM BROMIDE 100 MG/10ML IV SOLN
INTRAVENOUS | Status: DC | PRN
  Administered 2013-07-02: 40 mg via INTRAVENOUS

## 2013-07-02 MED ORDER — LIDOCAINE HCL (CARDIAC) 20 MG/ML IV SOLN
INTRAVENOUS | Status: DC | PRN
  Administered 2013-07-02: 80 mg via INTRAVENOUS

## 2013-07-02 MED ORDER — CEFAZOLIN SODIUM-DEXTROSE 2-3 GM-% IV SOLR
INTRAVENOUS | Status: AC
  Filled 2013-07-02: qty 50

## 2013-07-02 MED ORDER — SODIUM CHLORIDE 0.9 % IV SOLN
INTRAVENOUS | Status: DC | PRN
  Administered 2013-07-02: 14:00:00

## 2013-07-02 MED ORDER — BUPIVACAINE-EPINEPHRINE 0.25% -1:200000 IJ SOLN
INTRAMUSCULAR | Status: DC | PRN
  Administered 2013-07-02: 30 mL

## 2013-07-02 MED ORDER — OXYCODONE HCL 5 MG PO TABS: 5.0000 mg | ORAL_TABLET | Freq: Once | ORAL | Status: DC | PRN

## 2013-07-02 MED ORDER — HEMOSTATIC AGENTS (NO CHARGE) OPTIME
TOPICAL | Status: DC | PRN
  Administered 2013-07-02: 1 via TOPICAL

## 2013-07-02 MED ORDER — HYDROMORPHONE HCL PF 1 MG/ML IJ SOLN
INTRAMUSCULAR | Status: AC
  Filled 2013-07-02: qty 1

## 2013-07-02 MED ORDER — GLYCOPYRROLATE 0.2 MG/ML IJ SOLN
INTRAMUSCULAR | Status: DC | PRN
  Administered 2013-07-02: 0.6 mg via INTRAVENOUS

## 2013-07-02 MED ORDER — NEOSTIGMINE METHYLSULFATE 1 MG/ML IJ SOLN
INTRAMUSCULAR | Status: DC | PRN
  Administered 2013-07-02: 4 mg via INTRAVENOUS

## 2013-07-02 MED ORDER — ONDANSETRON HCL 4 MG/2ML IJ SOLN
INTRAMUSCULAR | Status: DC | PRN
  Administered 2013-07-02: 4 mg via INTRAVENOUS

## 2013-07-02 MED ORDER — OXYCODONE HCL 5 MG/5ML PO SOLN: 5.0000 mg | Freq: Once | ORAL | Status: DC | PRN

## 2013-07-02 MED ORDER — MORPHINE SULFATE 2 MG/ML IJ SOLN
1.0000 mg | INTRAMUSCULAR | Status: DC | PRN
  Administered 2013-07-02: 2 mg via INTRAVENOUS
  Filled 2013-07-02: qty 1

## 2013-07-02 MED ORDER — LACTATED RINGERS IV SOLN
INTRAVENOUS | Status: DC | PRN
  Administered 2013-07-02 (×2): via INTRAVENOUS

## 2013-07-02 MED ORDER — HYDROMORPHONE HCL PF 1 MG/ML IJ SOLN
0.2500 mg | INTRAMUSCULAR | Status: DC | PRN
  Administered 2013-07-02: 0.5 mg via INTRAVENOUS

## 2013-07-02 MED ORDER — PIPERACILLIN-TAZOBACTAM 3.375 G IVPB 30 MIN
3.3750 g | Freq: Once | INTRAVENOUS | Status: DC
  Filled 2013-07-02: qty 50

## 2013-07-02 MED ORDER — PROPOFOL 10 MG/ML IV BOLUS
INTRAVENOUS | Status: DC | PRN
  Administered 2013-07-02: 200 mg via INTRAVENOUS

## 2013-07-02 MED ORDER — OXYCODONE-ACETAMINOPHEN 5-325 MG PO TABS
1.0000 | ORAL_TABLET | ORAL | Status: DC | PRN
  Administered 2013-07-02 – 2013-07-03 (×2): 2 via ORAL
  Filled 2013-07-02 (×2): qty 2

## 2013-07-02 MED ORDER — 0.9 % SODIUM CHLORIDE (POUR BTL) OPTIME
TOPICAL | Status: DC | PRN
  Administered 2013-07-02: 1000 mL

## 2013-07-02 MED ORDER — HEPARIN SODIUM (PORCINE) 5000 UNIT/ML IJ SOLN
5000.0000 [IU] | Freq: Three times a day (TID) | INTRAMUSCULAR | Status: DC
  Administered 2013-07-03 – 2013-07-04 (×4): 5000 [IU] via SUBCUTANEOUS
  Filled 2013-07-02 (×7): qty 1

## 2013-07-02 MED ORDER — BUPIVACAINE-EPINEPHRINE PF 0.25-1:200000 % IJ SOLN
INTRAMUSCULAR | Status: AC
  Filled 2013-07-02: qty 30

## 2013-07-02 MED ORDER — FENTANYL CITRATE 0.05 MG/ML IJ SOLN
INTRAMUSCULAR | Status: DC | PRN
  Administered 2013-07-02: 50 ug via INTRAVENOUS
  Administered 2013-07-02: 100 ug via INTRAVENOUS
  Administered 2013-07-02: 50 ug via INTRAVENOUS

## 2013-07-02 MED ORDER — PROMETHAZINE HCL 25 MG/ML IJ SOLN: 6.2500 mg | INTRAMUSCULAR | Status: DC | PRN

## 2013-07-02 MED ORDER — SODIUM CHLORIDE 0.9 % IR SOLN
Status: DC | PRN
  Administered 2013-07-02: 1000 mL

## 2013-07-02 SURGICAL SUPPLY — 50 items
APL SKNCLS STERI-STRIP NONHPOA (GAUZE/BANDAGES/DRESSINGS) ×1
APPLIER CLIP 5 13 M/L LIGAMAX5 (MISCELLANEOUS) ×2
APR CLP MED LRG 5 ANG JAW (MISCELLANEOUS) ×1
BAG SPEC RTRVL LRG 6X4 10 (ENDOMECHANICALS) ×1
BANDAGE ADHESIVE 1X3 (GAUZE/BANDAGES/DRESSINGS) ×6 IMPLANT
BENZOIN TINCTURE PRP APPL 2/3 (GAUZE/BANDAGES/DRESSINGS) ×2 IMPLANT
BLADE SURG ROTATE 9660 (MISCELLANEOUS) ×1 IMPLANT
CANISTER SUCTION 2500CC (MISCELLANEOUS) ×2 IMPLANT
CHLORAPREP W/TINT 26ML (MISCELLANEOUS) ×2 IMPLANT
CLIP APPLIE 5 13 M/L LIGAMAX5 (MISCELLANEOUS) ×1 IMPLANT
CLOTH BEACON ORANGE TIMEOUT ST (SAFETY) ×2 IMPLANT
COVER MAYO STAND STRL (DRAPES) ×2 IMPLANT
COVER SURGICAL LIGHT HANDLE (MISCELLANEOUS) ×2 IMPLANT
DECANTER SPIKE VIAL GLASS SM (MISCELLANEOUS) ×1 IMPLANT
DRAPE C-ARM 42X72 X-RAY (DRAPES) ×2 IMPLANT
DRAPE UTILITY 15X26 W/TAPE STR (DRAPE) ×4 IMPLANT
DRSG TEGADERM 4X4.75 (GAUZE/BANDAGES/DRESSINGS) ×2 IMPLANT
ELECT REM PT RETURN 9FT ADLT (ELECTROSURGICAL) ×2
ELECTRODE REM PT RTRN 9FT ADLT (ELECTROSURGICAL) ×1 IMPLANT
GAUZE SPONGE 2X2 8PLY STRL LF (GAUZE/BANDAGES/DRESSINGS) ×1 IMPLANT
GLOVE BIO SURGEON STRL SZ7.5 (GLOVE) ×2 IMPLANT
GLOVE BIOGEL M STRL SZ7.5 (GLOVE) ×2 IMPLANT
GLOVE BIOGEL PI IND STRL 7.0 (GLOVE) IMPLANT
GLOVE BIOGEL PI IND STRL 7.5 (GLOVE) IMPLANT
GLOVE BIOGEL PI IND STRL 8 (GLOVE) ×1 IMPLANT
GLOVE BIOGEL PI INDICATOR 7.0 (GLOVE) ×1
GLOVE BIOGEL PI INDICATOR 7.5 (GLOVE) ×3
GLOVE BIOGEL PI INDICATOR 8 (GLOVE) ×1
GLOVE SURG SS PI 7.0 STRL IVOR (GLOVE) ×1 IMPLANT
GOWN STRL NON-REIN LRG LVL3 (GOWN DISPOSABLE) ×7 IMPLANT
GOWN STRL REIN XL XLG (GOWN DISPOSABLE) ×2 IMPLANT
HEMOSTAT SNOW SURGICEL 2X4 (HEMOSTASIS) ×1 IMPLANT
KIT BASIN OR (CUSTOM PROCEDURE TRAY) ×2 IMPLANT
KIT ROOM TURNOVER OR (KITS) ×2 IMPLANT
NS IRRIG 1000ML POUR BTL (IV SOLUTION) ×2 IMPLANT
PAD ARMBOARD 7.5X6 YLW CONV (MISCELLANEOUS) ×2 IMPLANT
POUCH SPECIMEN RETRIEVAL 10MM (ENDOMECHANICALS) ×2 IMPLANT
SCISSORS LAP 5X35 DISP (ENDOMECHANICALS) ×1 IMPLANT
SET CHOLANGIOGRAPH 5 50 .035 (SET/KITS/TRAYS/PACK) ×2 IMPLANT
SET IRRIG TUBING LAPAROSCOPIC (IRRIGATION / IRRIGATOR) ×2 IMPLANT
SLEEVE ENDOPATH XCEL 5M (ENDOMECHANICALS) ×4 IMPLANT
SPECIMEN JAR SMALL (MISCELLANEOUS) ×2 IMPLANT
SPONGE GAUZE 2X2 STER 10/PKG (GAUZE/BANDAGES/DRESSINGS) ×1
SUT MNCRL AB 4-0 PS2 18 (SUTURE) ×2 IMPLANT
TOWEL OR 17X24 6PK STRL BLUE (TOWEL DISPOSABLE) ×2 IMPLANT
TOWEL OR 17X26 10 PK STRL BLUE (TOWEL DISPOSABLE) ×2 IMPLANT
TRAY LAPAROSCOPIC (CUSTOM PROCEDURE TRAY) ×2 IMPLANT
TROCAR XCEL BLUNT TIP 100MML (ENDOMECHANICALS) ×2 IMPLANT
TROCAR XCEL NON-BLD 11X100MML (ENDOMECHANICALS) ×1 IMPLANT
TROCAR XCEL NON-BLD 5MMX100MML (ENDOMECHANICALS) ×2 IMPLANT

## 2013-07-02 NOTE — Progress Notes (Signed)
Subjective: C/o of some upper abd gaseous discomfort. No abd pain, n/v  Objective: Vital signs in last 24 hours: Temp:  [98.2 F (36.8 C)-98.9 F (37.2 C)] 98.2 F (36.8 C) (09/25 0554) Pulse Rate:  [52-56] 56 (09/25 0554) Resp:  [18-20] 18 (09/25 0554) BP: (123-136)/(56-58) 127/56 mmHg (09/25 0554) SpO2:  [98 %-100 %] 98 % (09/25 0554) Last BM Date: 06/29/13  Intake/Output from previous day: 09/24 0701 - 09/25 0700 In: 2985 [P.O.:660; I.V.:2325] Out: 2850 [Urine:2850] Intake/Output this shift:    Alert, nad cta Reg Soft, obese, old incisions. nt  Lab Results:   Recent Labs  07/01/13 0650 07/02/13 0735  WBC 5.0 5.2  HGB 12.5* 12.0*  HCT 36.1* 34.1*  PLT 207 209   BMET  Recent Labs  07/01/13 0650 07/02/13 0735  NA 144 141  K 4.3 4.5  CL 105 104  CO2 27 29  GLUCOSE 266* 203*  BUN 16 9  CREATININE 1.22 1.23  CALCIUM 9.3 9.2   PT/INR  Recent Labs  07/01/13 0032  LABPROT 12.5  INR 0.95   ABG No results found for this basename: PHART, PCO2, PO2, HCO3,  in the last 72 hours  Studies/Results: US Abdomen Complete  06/30/2013   CLINICAL DATA:  One week history of epigastric abdominal pain. Elevated LFTs and elevated serum lipase currently. Current history of diabetes, hypertension, and hyperlipidemia.  EXAM: COMPLETE ABDOMEN ULTRASOUND  COMPARISON:  None.  FINDINGS: Gallbladder  Large amount of echogenic sludge with probable very small gallstones. Thickened gallbladder wall up to approximately 6 mm. No pericholecystic fluid. Negative sonographic Murphy sign according to the ultrasound technologist.  Common bile duct  Diameter: 6 mm.  Liver  Diffusely increased and coarsened echotexture without focal parenchymal abnormality. Patent portal vein with hepatopetal flow.  IVC  Patent.  Pancreas  Obscured by midline bowel gas.  Spleen  Normal size and echotexture without focal parenchymal abnormality.  Right Kidney  Length: Approximately 11.2 cm. Well-preserved  cortex. Normal parenchymal echotexture. No focal parenchymal abnormality. No visible shadowing calculi.  Left Kidney  Length: Approximately 12.1 cm. Well-preserved cortex. Normal parenchymal echotexture. No focal parenchymal abnormality. No visible shadowing calculi.  Abdominal aorta  Normal in caliber through its visualized course the abdomen; obscured proximally and at the bifurcation by overlying bowel gas.  IMPRESSION: 1. Large amount of gallbladder sludge and probable small gallstones. Gallbladder wall thickening is consistent with cholecystitis; in the absence of a sonographic Murphy sign, this is likely chronic cholecystitis. 2. Diffuse hepatic steatosis and/or hepatocellular disease without focal hepatic parenchymal abnormality. 3. Otherwise normal examination with a caveat that the pancreas, the proximal abdominal aorta, and the distal abdominal aorta at the bifurcation were obscured by overlying bowel gas and were cyber poor therefore not evaluated.   Electronically Signed   By: Hulan Saas   On: 06/30/2013 22:01   Dg Chest Port 1 View  06/30/2013   CLINICAL DATA:  Mid chest pain for 1 week. Previous smoker. History of hypertension and diabetes.  EXAM: PORTABLE CHEST - 1 VIEW  COMPARISON:  02/22/2008.  FINDINGS: 2058 hr. The heart size and mediastinal contours are normal. The lungs are clear. There is no pleural effusion or pneumothorax. No acute osseous findings are identified. Telemetry leads overlie the chest.  IMPRESSION: No active cardiopulmonary process.   Electronically Signed   By: Roxy Horseman   On: 06/30/2013 21:16    Anti-infectives: Anti-infectives   Start     Dose/Rate Route Frequency Ordered Stop  07/01/13 1130  piperacillin-tazobactam (ZOSYN) IVPB 3.375 g     3.375 g 12.5 mL/hr over 240 Minutes Intravenous 3 times per day 07/01/13 1115        Assessment/Plan: Chronic cholecystitis Gallstone pancreatitis  Labs trending down. Believe it is ok for surgery today.   I  believe the patient's symptoms are consistent with gallbladder disease.  I discussed laparoscopic cholecystectomy with IOC with possible open in detail.  The patient was shown diagrams detailing the procedure.  We discussed the risks and benefits of a laparoscopic cholecystectomy including, but not limited to bleeding, infection, injury to surrounding structures such as the intestine or liver, bile leak, retained gallstones, need to convert to an open procedure, prolonged diarrhea, blood clots such as  DVT, common bile duct injury, anesthesia risks, and possible need for additional procedures.  We discussed the typical post-operative recovery course. I explained that the likelihood of improvement of their symptoms is good.  Pt has elected to proceed with surgery  Mary Sella. Andrey Campanile, MD, FACS General, Bariatric, & Minimally Invasive Surgery [facility], [place]   LOS: 2 days    Atilano Ina 07/02/2013

## 2013-07-02 NOTE — Preoperative (Signed)
Beta Blockers   Reason not to administer Beta Blockers:Not Applicable 

## 2013-07-02 NOTE — Anesthesia Postprocedure Evaluation (Signed)
  Anesthesia Post-op Note  Patient: [name]  Procedure(s) Performed: Procedure(s): LAPAROSCOPIC CHOLECYSTECTOMY WITH INTRAOPERATIVE CHOLANGIOGRAM  (N/A)  Patient Location: PACU  Anesthesia Type:General  Level of Consciousness: awake, alert , oriented and patient cooperative  Airway and Oxygen Therapy: Patient Spontanous Breathing  Post-op Pain: mild  Post-op Assessment: Post-op Vital signs reviewed, Patient's Cardiovascular Status Stable, Respiratory Function Stable, Patent Airway, No signs of Nausea or vomiting and Pain level controlled  Post-op Vital Signs: Reviewed and stable  Complications: No apparent anesthesia complications

## 2013-07-02 NOTE — Transfer of Care (Signed)
Immediate Anesthesia Transfer of Care Note  Patient: [name]  Procedure(s) Performed: Procedure(s): LAPAROSCOPIC CHOLECYSTECTOMY WITH INTRAOPERATIVE CHOLANGIOGRAM  (N/A)  Patient Location: PACU  Anesthesia Type:General  Level of Consciousness: awake, alert  and oriented  Airway & Oxygen Therapy: Patient Spontanous Breathing and Patient connected to nasal cannula oxygen  Post-op Assessment: Report given to PACU RN and Post -op Vital signs reviewed and stable  Post vital signs: Reviewed and stable  Complications: No apparent anesthesia complications

## 2013-07-02 NOTE — Anesthesia Procedure Notes (Signed)
Procedure Name: Intubation Date/Time: 07/02/2013 12:42 PM Performed by: Rogelia Boga Pre-anesthesia Checklist: Patient identified, Emergency Drugs available, Suction available, Patient being monitored and Timeout performed Patient Re-evaluated:Patient Re-evaluated prior to inductionOxygen Delivery Method: Circle system utilized Preoxygenation: Pre-oxygenation with 100% oxygen Intubation Type: IV induction Ventilation: Mask ventilation without difficulty Laryngoscope Size: Mac and 4 Grade View: Grade I Tube type: Oral Number of attempts: 1 Airway Equipment and Method: Stylet Placement Confirmation: ETT inserted through vocal cords under direct vision,  positive ETCO2 and breath sounds checked- equal and bilateral Secured at: 22 cm Tube secured with: Tape Dental Injury: Teeth and Oropharynx as per pre-operative assessment

## 2013-07-02 NOTE — Op Note (Signed)
Laparoscopic Cholecystectomy with IOC Procedure Note  Indications: This patient presents with symptomatic gallbladder disease and will undergo laparoscopic cholecystectomy.  Pre-operative Diagnosis: Gallstone pancreatitis  Post-operative Diagnosis: gallstone pancreatitis, chronic cholecystitis  Surgeon: Atilano Ina   Assistants: none  Anesthesia: General endotracheal anesthesia  ASA Class: 2  Procedure Details  The patient was seen again in the Holding Room. The risks, benefits, complications, treatment options, and expected outcomes were discussed with the patient. The possibilities of reaction to medication, pulmonary aspiration, perforation of viscus, bleeding, recurrent infection, finding a normal gallbladder, the need for additional procedures, failure to diagnose a condition, the possible need to convert to an open procedure, and creating a complication requiring transfusion or operation were discussed with the patient. The likelihood of improving the patient's symptoms with return to their baseline status is good.  The patient and/or family concurred with the proposed plan, giving informed consent. The site of surgery properly noted. The patient was taken to Operating Room, identified as [name] and the procedure verified as Laparoscopic Cholecystectomy with Intraoperative Cholangiogram. A Time Out was held and the above information confirmed.  Prior to the induction of general anesthesia, antibiotic prophylaxis was confirmed- the patient was on therapeutic scheduled antibiotic. General endotracheal anesthesia was then administered and tolerated well. After the induction, the abdomen was prepped with Chloraprep and draped in the sterile fashion. The patient was positioned in the supine position.  Because of his prior midline surgery, I elected to gain access to the abdominal cavity using the optiview technique in the right upper quadrant. Local anesthetic agent was injected  into the skin in the right upper quadrant about 2 fingerbreaths under the right subcostal margin and a 1cm incision made. Using a 5 mm 0 laparoscope was advanced through a 5 mm trocar and advanced thru all layers of the abdominal wall and the abdominal cavity was entered. The abdominal cavity was surveilled. There was one loop of small bowel stuck tothe abdominal wall in the right lower quadrant. The omentum was also stuck in the left upper quadrant and left mid abdomen. However the right upper quadrant was free of adhesive disease.a 5 mm trocar was placed slightly to the right of the umbilicus under direct visualization. An 11 mm trocar was placed in the subxiphoid position. An additional 5 mm was placed in the right lateral abdominal wall  All skin incisions were infiltrated with a local anesthetic agent before making the incision and placing the trocars.   We positioned the patient in reverse Trendelenburg, tilted slightly to the patient's left.  The gallbladder was identified, the fundus grasped and retracted cephalad. Adhesions were lysed bluntly and with the electrocautery where indicated, taking care not to injure any adjacent organs or viscus. The infundibulum was grasped and retracted laterally, exposing the peritoneum overlying the triangle of Calot. This was then divided and exposed in a blunt fashion. A critical view of the cystic duct and cystic artery was obtained.  The cystic duct was clearly identified and bluntly dissected circumferentially. The cystic duct was ligated with a clip distally.   An incision was made in the cystic duct and the [facility] cholangiogram catheter introduced. The catheter was secured using a clip. A cholangiogram was then obtained which showed good visualization of the distal and proximal biliary tree with no sign of filling defects or obstruction.  Contrast flowed easily into the duodenum. The catheter was then removed.   The cystic duct was then ligated with clips and  divided. The cystic  artery was identified, dissected free, ligated with clips and divided as well.   The gallbladder was dissected from the liver bed in retrograde fashion with the electrocautery. There was some bleeding in the middle of the gallbladder fossa.  The gallbladder was removed and placed in an Endocatch sac.  The liver bed was irrigated and inspected. Hemostasis was achieved with the electrocautery. A piece of Ethicon Surigcal SNoW was placed in the gallbladder fossa after copious irrigation was utilized and was repeatedly aspirated until clear.  The gallbladder and Endocatch sac were then removed through the subxiphoid port site. A 0 vicryl suture was placed in the fascia in the subxiphoid position. There was no air leak.   We again inspected the right upper quadrant for hemostasis.   4-0 Monocryl was used to close the skin.   Benzoin, steri-strips, and clean dressings were applied. The patient was then extubated and brought to the recovery room in stable condition. Instrument, sponge, and needle counts were correct at closure and at the conclusion of the case.   Findings: Chronic Cholecystitis with Cholelithiasis  Estimated Blood Loss: Minimal         Drains: none         Specimens: Gallbladder           Complications: None; patient tolerated the procedure well.         Disposition: PACU - hemodynamically stable.         Condition: stable  Mary Sella. Andrey Campanile, MD, FACS General, Bariatric, & Minimally Invasive Surgery [facility] Surgery, [place]

## 2013-07-02 NOTE — Anesthesia Preprocedure Evaluation (Signed)
Anesthesia Evaluation  Patient identified by MRN, date of birth, ID band Patient awake    Reviewed: Allergy & Precautions, H&P , NPO status , Patient's Chart, lab work & pertinent test results  History of Anesthesia Complications Negative for: history of anesthetic complications  Airway Mallampati: II TM Distance: >3 FB Neck ROM: Full    Dental  (+) Edentulous Upper, Edentulous Lower and Dental Advisory Given   Pulmonary former smoker,  Quit smoking 2005   Pulmonary exam normal       Cardiovascular hypertension, Pt. on medications Rhythm:Regular Rate:Normal     Neuro/Psych negative neurological ROS  negative psych ROS   GI/Hepatic GERD-  Medicated and Controlled,  Endo/Other  diabetes, Well Controlled, Type 2, Insulin Dependent  Renal/GU      Musculoskeletal negative musculoskeletal ROS (+)   Abdominal   Peds  Hematology negative hematology ROS (+)   Anesthesia Other Findings   Reproductive/Obstetrics                           Anesthesia Physical Anesthesia Plan  ASA: II  Anesthesia Plan: General   Post-op Pain Management:    Induction: Intravenous  Airway Management Planned: Oral ETT  Additional Equipment:   Intra-op Plan:   Post-operative Plan: Extubation in OR  Informed Consent: I have reviewed the patients History and Physical, chart, labs and discussed the procedure including the risks, benefits and alternatives for the proposed anesthesia with the patient or authorized representative who has indicated his/her understanding and acceptance.   Dental advisory given  Plan Discussed with: Anesthesiologist and Surgeon  Anesthesia Plan Comments:         Anesthesia Quick Evaluation

## 2013-07-02 NOTE — Progress Notes (Signed)
Subjective:  Patient denies any chest pain or shortness of breath tolerated laparoscopic cholecystectomy today. Doing well  Objective:  Vital Signs in the last 24 hours: Temp:  [97.7 F (36.5 C)-98.9 F (37.2 C)] 97.8 F (36.6 C) (09/25 1503) Pulse Rate:  [51-70] 58 (09/25 1503) Resp:  [12-20] 13 (09/25 1503) BP: (123-150)/(55-64) 132/64 mmHg (09/25 1503) SpO2:  [98 %-100 %] 100 % (09/25 1503)  Intake/Output from previous day: 09/24 0701 - 09/25 0700 In: 2985 [P.O.:660; I.V.:2325] Out: 2850 [Urine:2850] Intake/Output from this shift: Total I/O In: 1300 [I.V.:1300] Out: 15 [Blood:15]  Physical Exam: Neck: no adenopathy, no carotid bruit, no JVD and supple, symmetrical, trachea midline Lungs: clear to auscultation bilaterally Heart: regular rate and rhythm, S1, S2 normal, no murmur, click, rub or gallop Abdomen: Soft bowel sounds absent surgical dressing dry Extremities: extremities normal, atraumatic, no cyanosis or edema and Homans sign is negative, no sign of DVT  Lab Results:  Recent Labs  07/01/13 0650 07/02/13 0735  WBC 5.0 5.2  HGB 12.5* 12.0*  PLT 207 209    Recent Labs  07/01/13 0650 07/02/13 0735  NA 144 141  K 4.3 4.5  CL 105 104  CO2 27 29  GLUCOSE 266* 203*  BUN 16 9  CREATININE 1.22 1.23    Recent Labs  07/01/13 0650 07/01/13 1013  TROPONINI <0.30 <0.30   Hepatic Function Panel  Recent Labs  07/02/13 0735  PROT 6.9  ALBUMIN 3.2*  AST 46*  ALT 89*  ALKPHOS 122*  BILITOT 1.1    Recent Labs  07/01/13 0650  CHOL 122   No results found for this basename: PROTIME,  in the last 72 hours  Imaging: Imaging results have been reviewed and Dg Cholangiogram Operative  07/02/2013   CLINICAL DATA:  Cholecystitis  EXAM: INTRAOPERATIVE CHOLANGIOGRAM  TECHNIQUE: Cholangiographic images from the C-arm fluoroscopic device were submitted for interpretation post-operatively. Please see the procedural report for the amount of contrast and the  fluoroscopy time utilized.  COMPARISON:  None.  FINDINGS: Gallbladder is been removed, and the cystic duct has been cannulated. There is incomplete filling of the intrahepatic biliary ducts. Visualized intrahepatic biliary ducts appear normal. Common hepatic and common bile ducts appear normal. There is no mass or calculus seen in the visualized biliary duct system. There is apparent free flow of contrast via the common bile duct into the duodenum. There is some reflux into the pancreatic duct.  IMPRESSION: Incomplete visualization of the intrahepatic biliary ducts. Common hepatic and common bile ducts appear normal. No mass or calculus seen.   Electronically Signed   By: Bretta Bang   On: 07/02/2013 14:29   US Abdomen Complete  06/30/2013   CLINICAL DATA:  One week history of epigastric abdominal pain. Elevated LFTs and elevated serum lipase currently. Current history of diabetes, hypertension, and hyperlipidemia.  EXAM: COMPLETE ABDOMEN ULTRASOUND  COMPARISON:  None.  FINDINGS: Gallbladder  Large amount of echogenic sludge with probable very small gallstones. Thickened gallbladder wall up to approximately 6 mm. No pericholecystic fluid. Negative sonographic Murphy sign according to the ultrasound technologist.  Common bile duct  Diameter: 6 mm.  Liver  Diffusely increased and coarsened echotexture without focal parenchymal abnormality. Patent portal vein with hepatopetal flow.  IVC  Patent.  Pancreas  Obscured by midline bowel gas.  Spleen  Normal size and echotexture without focal parenchymal abnormality.  Right Kidney  Length: Approximately 11.2 cm. Well-preserved cortex. Normal parenchymal echotexture. No focal parenchymal abnormality. No visible shadowing  calculi.  Left Kidney  Length: Approximately 12.1 cm. Well-preserved cortex. Normal parenchymal echotexture. No focal parenchymal abnormality. No visible shadowing calculi.  Abdominal aorta  Normal in caliber through its visualized course the  abdomen; obscured proximally and at the bifurcation by overlying bowel gas.  IMPRESSION: 1. Large amount of gallbladder sludge and probable small gallstones. Gallbladder wall thickening is consistent with cholecystitis; in the absence of a sonographic Murphy sign, this is likely chronic cholecystitis. 2. Diffuse hepatic steatosis and/or hepatocellular disease without focal hepatic parenchymal abnormality. 3. Otherwise normal examination with a caveat that the pancreas, the proximal abdominal aorta, and the distal abdominal aorta at the bifurcation were obscured by overlying bowel gas and were cyber poor therefore not evaluated.   Electronically Signed   By: Hulan Saas   On: 06/30/2013 22:01   Dg Chest Port 1 View  06/30/2013   CLINICAL DATA:  Mid chest pain for 1 week. Previous smoker. History of hypertension and diabetes.  EXAM: PORTABLE CHEST - 1 VIEW  COMPARISON:  02/22/2008.  FINDINGS: 2058 hr. The heart size and mediastinal contours are normal. The lungs are clear. There is no pleural effusion or pneumothorax. No acute osseous findings are identified. Telemetry leads overlie the chest.  IMPRESSION: No active cardiopulmonary process.   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  LOS: 2 days    [name] N 07/02/2013, 3:59 PM

## 2013-07-03 LAB — GLUCOSE, CAPILLARY
Glucose-Capillary: 206 mg/dL — ABNORMAL HIGH (ref 70–99)
Glucose-Capillary: 153 mg/dL — ABNORMAL HIGH (ref 70–99)
Glucose-Capillary: 264 mg/dL — ABNORMAL HIGH (ref 70–99)

## 2013-07-03 LAB — CBC
Hemoglobin: 11.5 g/dL — ABNORMAL LOW (ref 13.0–17.0)
MCH: 26 pg (ref 26.0–34.0)
MCV: 77 fL — ABNORMAL LOW (ref 78.0–100.0)
Platelets: 226 10*3/uL (ref 150–400)
RDW: 13 % (ref 11.5–15.5)
WBC: 5.9 10*3/uL (ref 4.0–10.5)

## 2013-07-03 LAB — BASIC METABOLIC PANEL
Calcium: 8.7 mg/dL (ref 8.4–10.5)
Chloride: 104 mEq/L (ref 96–112)
Creatinine, Ser: 1.18 mg/dL (ref 0.50–1.35)
GFR calc Af Amer: 76 mL/min — ABNORMAL LOW (ref >90)
Glucose, Bld: 145 mg/dL — ABNORMAL HIGH (ref 70–99)
Sodium: 139 mEq/L (ref 135–145)

## 2013-07-03 LAB — LIPASE, BLOOD: Lipase: 29 U/L (ref 11–59)

## 2013-07-03 NOTE — Progress Notes (Signed)
Subjective:   patient complains of vague abdominal pain at surgical site.  Tolerating clear fluids.  Diet is being advanced has not had lunch yet.  No BM but passing gas.  Patient and family don't feel comfortable being discharged today Objective:  Vital Signs in the last 24 hours: Temp:  [97.7 F (36.5 C)-100.2 F (37.9 C)] 98 F (36.7 C) (09/26 0500) Pulse Rate:  [51-78] 72 (09/26 0500) Resp:  [12-16] 16 (09/25 1609) BP: (115-152)/(55-73) 115/73 mmHg (09/26 0500) SpO2:  [96 %-100 %] 97 % (09/26 0500) Weight:  [89.631 kg (197 lb 9.6 oz)] 89.631 kg (197 lb 9.6 oz) (09/26 0500)  Intake/Output from previous day: 09/25 0701 - 09/26 0700 In: 1660 [P.O.:360; I.V.:1300] Out: 1490 [Urine:1475; Blood:15] Intake/Output from this shift: Total I/O In: -  Out: 810 [Urine:810]  Physical Exam: Neck: no adenopathy, no carotid bruit, no JVD and supple, symmetrical, trachea midline Lungs: clear to auscultation bilaterally Heart: regular rate and rhythm, S1, S2 normal, no murmur, click, rub or gallop Abdomen: soft, non-tender; bowel sounds normal; no masses,  no organomegaly and surgical site dry Extremities: extremities normal, atraumatic, no cyanosis or edema  Lab Results:  Recent Labs  07/02/13 0735 07/03/13 0525  WBC 5.2 5.9  HGB 12.0* 11.5*  PLT 209 226    Recent Labs  07/02/13 0735 07/03/13 0525  NA 141 139  K 4.5 3.8  CL 104 104  CO2 29 25  GLUCOSE 203* 145*  BUN 9 8  CREATININE 1.23 1.18    Recent Labs  07/01/13 0650 07/01/13 1013  TROPONINI <0.30 <0.30   Hepatic Function Panel  Recent Labs  07/02/13 0735  PROT 6.9  ALBUMIN 3.2*  AST 46*  ALT 89*  ALKPHOS 122*  BILITOT 1.1    Recent Labs  07/01/13 0650  CHOL 122   No results found for this basename: PROTIME,  in the last 72 hours  Imaging: Imaging results have been reviewed and Dg Cholangiogram Operative  07/02/2013   CLINICAL DATA:  Cholecystitis  EXAM: INTRAOPERATIVE CHOLANGIOGRAM  TECHNIQUE:  Cholangiographic images from the C-arm fluoroscopic device were submitted for interpretation post-operatively. Please see the procedural report for the amount of contrast and the fluoroscopy time utilized.  COMPARISON:  None.  FINDINGS: Gallbladder is been removed, and the cystic duct has been cannulated. There is incomplete filling of the intrahepatic biliary ducts. Visualized intrahepatic biliary ducts appear normal. Common hepatic and common bile ducts appear normal. There is no mass or calculus seen in the visualized biliary duct system. There is apparent free flow of contrast via the common bile duct into the duodenum. There is some reflux into the pancreatic duct.  IMPRESSION: Incomplete visualization of the intrahepatic biliary ducts. Common hepatic and common bile ducts appear normal. No mass or calculus seen.   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

## 2013-07-03 NOTE — Progress Notes (Signed)
Doing well. No n/v. Tolerating diet, abd soreness  Alert, nad Soft, approp TTP. Incisions ok  Ok for discharge. However pt's wife says she wants him to stay overnight just to make sure.   Discussed d/c instructions with pt  [name]. Andrey Campanile, MD, FACS General, Bariatric, & Minimally Invasive Surgery [facility] Surgery, [place]

## 2013-07-03 NOTE — Progress Notes (Signed)
1 Day Post-Op  Subjective: Doing well post-op, tolerated clears last night and this morning, denies abdominal pain other than soreness post-op, passing gas (no BM yet), denies bleeding. Plans to get up and walk some today. States urinating with no burning but small flow, though picking up post-op.  Objective: Vital signs in last 24 hours: Temp:  [97.7 F (36.5 C)-100.2 F (37.9 C)] 98 F (36.7 C) (09/26 0500) Pulse Rate:  [51-78] 72 (09/26 0500) Resp:  [12-20] 16 (09/25 1609) BP: (115-152)/(55-73) 115/73 mmHg (09/26 0500) SpO2:  [96 %-100 %] 97 % (09/26 0500) Weight:  [197 lb 9.6 oz (89.631 kg)] 197 lb 9.6 oz (89.631 kg) (09/26 0500) Last BM Date: 06/29/13  Intake/Output from previous day: 09/25 0701 - 09/26 0700 In: 1660 [P.O.:360; I.V.:1300] Out: 1490 [Urine:1475; Blood:15] Intake/Output this shift: Total I/O In: -  Out: 810 [Urine:810]  PE: Abd: Soft, nontender, mildly distended, NABS, incisions appear dry and in tact Pulm: Normal effort  Lab Results:   Recent Labs  07/02/13 0735 07/03/13 0525  WBC 5.2 5.9  HGB 12.0* 11.5*  HCT 34.1* 34.1*  PLT 209 226   BMET  Recent Labs  07/02/13 0735 07/03/13 0525  NA 141 139  K 4.5 3.8  CL 104 104  CO2 29 25  GLUCOSE 203* 145*  BUN 9 8  CREATININE 1.23 1.18  CALCIUM 9.2 8.7   PT/INR  Recent Labs  07/01/13 0032  LABPROT 12.5  INR 0.95   CMP     Component Value Date/Time   NA 139 07/03/2013 0525   K 3.8 07/03/2013 0525   CL 104 07/03/2013 0525   CO2 25 07/03/2013 0525   GLUCOSE 145* 07/03/2013 0525   BUN 8 07/03/2013 0525   CREATININE 1.18 07/03/2013 0525   CALCIUM 8.7 07/03/2013 0525   PROT 6.9 07/02/2013 0735   ALBUMIN 3.2* 07/02/2013 0735   AST 46* 07/02/2013 0735   ALT 89* 07/02/2013 0735   ALKPHOS 122* 07/02/2013 0735   BILITOT 1.1 07/02/2013 0735   GFRNONAA 65* 07/03/2013 0525   GFRAA 76* 07/03/2013 0525   Lipase     Component Value Date/Time   LIPASE 29 07/03/2013 0525       Studies/Results: Dg  Cholangiogram Operative  07/02/2013   CLINICAL DATA:  Cholecystitis  EXAM: INTRAOPERATIVE CHOLANGIOGRAM  TECHNIQUE: Cholangiographic images from the C-arm fluoroscopic device were submitted for interpretation post-operatively. Please see the procedural report for the amount of contrast and the fluoroscopy time utilized.  COMPARISON:  None.  FINDINGS: Gallbladder is been removed, and the cystic duct has been cannulated. There is incomplete filling of the intrahepatic biliary ducts. Visualized intrahepatic biliary ducts appear normal. Common hepatic and common bile ducts appear normal. There is no mass or calculus seen in the visualized biliary duct system. There is apparent free flow of contrast via the common bile duct into the duodenum. There is some reflux into the pancreatic duct.  IMPRESSION: Incomplete visualization of the intrahepatic biliary ducts. Common hepatic and common bile ducts appear normal. No mass or calculus seen.   Electronically Signed   By: Bretta Bang   On: 07/02/2013 14:29    Anti-infectives: Anti-infectives   Start     Dose/Rate Route Frequency Ordered Stop   07/02/13 1300  piperacillin-tazobactam (ZOSYN) IVPB 3.375 g  Status:  Discontinued     3.375 g 100 mL/hr over 30 Minutes Intravenous  Once 07/02/13 1252 07/02/13 1504   07/02/13 1231  ceFAZolin (ANCEF) 2-3 GM-% IVPB SOLR  CommentsDelfina Redwood: cabinet override      07/02/13 1231 07/03/13 0044   07/01/13 1130  piperacillin-tazobactam (ZOSYN) IVPB 3.375 g  Status:  Discontinued     3.375 g 12.5 mL/hr over 240 Minutes Intravenous 3 times per day 07/01/13 1115 07/02/13 1506       Assessment/Plan 1. S/p atypical chest pain MI ruled out 2. Gallstone pancreatitis 3. Chronic cholecystitis s/p lap chole POD 1  Plan: - POD 1 from laparoscopic cholecystectomy, doing well with no pain, tolerating clears - Hemoglobin stable today - Advance to regular diet today - From surgical standpoint, can be discharged home  today if tolerates regular diet for lunch - Patient has f/u appointment with surgery already made and listed under "follow-up" tab.    LOS: 3 days    Leona Singleton, MD 07/03/2013, 8:48 AM Pager: [phone]

## 2013-07-04 LAB — GLUCOSE, CAPILLARY

## 2013-07-04 MED ORDER — ASPIRIN 81 MG PO TBEC: 81.0000 mg | DELAYED_RELEASE_TABLET | Freq: Every day | ORAL | Status: AC

## 2013-07-04 MED ORDER — NITROGLYCERIN 0.4 MG SL SUBL: 0.4000 mg | SUBLINGUAL_TABLET | SUBLINGUAL | Status: AC | PRN

## 2013-07-04 NOTE — Progress Notes (Signed)
2 Days Post-Op  Subjective: No complaints. Tolerated diet well  Objective: Vital signs in last 24 hours: Temp:  [99.1 F (37.3 C)-99.4 F (37.4 C)] 99.2 F (37.3 C) (09/27 0520) Pulse Rate:  [66-80] 66 (09/27 0520) Resp:  [16-17] 16 (09/27 0520) BP: (125-154)/(51-59) 125/51 mmHg (09/27 0520) SpO2:  [97 %-99 %] 98 % (09/27 0520) Last BM Date: 06/29/13  Intake/Output from previous day: 09/26 0701 - 09/27 0700 In: 3228.8 [P.O.:960; I.V.:2268.8] Out: 4090 [Urine:4090] Intake/Output this shift:    GI: soft, nontender. tolerated diet  Lab Results:   Recent Labs  07/02/13 0735 07/03/13 0525  WBC 5.2 5.9  HGB 12.0* 11.5*  HCT 34.1* 34.1*  PLT 209 226   BMET  Recent Labs  07/02/13 0735 07/03/13 0525  NA 141 139  K 4.5 3.8  CL 104 104  CO2 29 25  GLUCOSE 203* 145*  BUN 9 8  CREATININE 1.23 1.18  CALCIUM 9.2 8.7   PT/INR No results found for this basename: LABPROT, INR,  in the last 72 hours ABG No results found for this basename: PHART, PCO2, PO2, HCO3,  in the last 72 hours  Studies/Results: Dg Cholangiogram Operative  07/02/2013   CLINICAL DATA:  Cholecystitis  EXAM: INTRAOPERATIVE CHOLANGIOGRAM  TECHNIQUE: Cholangiographic images from the C-arm fluoroscopic device were submitted for interpretation post-operatively. Please see the procedural report for the amount of contrast and the fluoroscopy time utilized.  COMPARISON:  None.  FINDINGS: Gallbladder is been removed, and the cystic duct has been cannulated. There is incomplete filling of the intrahepatic biliary ducts. Visualized intrahepatic biliary ducts appear normal. Common hepatic and common bile ducts appear normal. There is no mass or calculus seen in the visualized biliary duct system. There is apparent free flow of contrast via the common bile duct into the duodenum. There is some reflux into the pancreatic duct.  IMPRESSION: Incomplete visualization of the intrahepatic biliary ducts. Common hepatic and  common bile ducts appear normal. No mass or calculus seen.   Electronically Signed   By: Bretta Bang   On: 07/02/2013 14:29    Anti-infectives: Anti-infectives   Start     Dose/Rate Route Frequency Ordered Stop   07/02/13 1300  piperacillin-tazobactam (ZOSYN) IVPB 3.375 g  Status:  Discontinued     3.375 g 100 mL/hr over 30 Minutes Intravenous  Once 07/02/13 1252 07/02/13 1504   07/02/13 1231  ceFAZolin (ANCEF) 2-3 GM-% IVPB SOLR    Comments:  BUNN, ALISHA: cabinet override      07/02/13 1231 07/03/13 0044   07/01/13 1130  piperacillin-tazobactam (ZOSYN) IVPB 3.375 g  Status:  Discontinued     3.375 g 12.5 mL/hr over 240 Minutes Intravenous 3 times per day 07/01/13 1115 07/02/13 1506      Assessment/Plan: s/p Procedure(s): LAPAROSCOPIC CHOLECYSTECTOMY WITH INTRAOPERATIVE CHOLANGIOGRAM  (N/A) Discharge Follow up with Dr. Andrey Campanile in 2 weeks  LOS: 4 days    TOTH III,Ronak Duquette S 07/04/2013

## 2013-07-04 NOTE — Discharge Summary (Signed)
  Discharge summary dictated on 07/04/2013 dictation number is [phone]

## 2013-07-05 NOTE — Discharge Summary (Signed)
[name], [name]            ACCOUNT NO.:  [id number]  MEDICAL RECORD NO.:  [id number]  LOCATION:  3W02C                        FACILITY:  [facility]  PHYSICIAN:  Eduardo Osier. Sharyn Lull, M.D. DATE OF BIRTH:  1950/10/10  DATE OF ADMISSION:  06/30/2013 DATE OF DISCHARGE:  07/04/2013                              DISCHARGE SUMMARY   ADMITTING DIAGNOSES: 1. Atypical chest pain/abdominal pain, rule out myocardial infarction. 2. Acute pancreatitis, rule out cholelithiasis/cholecystitis. 3. Hypertension. 4. Insulin-requiring diabetes mellitus. 5. Hypercholesteremia. 6. History of alcohol and tobacco abuse.  FINAL DIAGNOSES: 1. Status post atypical chest pain, myocardial infarction ruled out. 2. Acute gallstone pancreatitis. 3. Chronic cholecystitis status post laparoscopic cholecystectomy this     admission. 4. Hypertension. 5. Insulin-requiring diabetes mellitus. 6. Hypercholesteremia. 7. History of tobacco abuse. 8. History of alcohol abuse in the past. 9. History of Fournier's gangrene in the past.  DISCHARGE HOME MEDICATIONS: 1. Enteric-coated aspirin 81 mg 1 tablet daily. 2. Nitrostat 0.4 mg sublingual use as directed.  The patient has been advised to continue rest of the home medicines, i.e. 1. Atorvastatin 20 mg daily. 2. Lantus SoloSTAR 30 units at bedtime as before. 3. Levothyroxine 100 mcg daily. 4. Zestoretic 20/12.5 mg daily. 5. Losartan 50 mg daily. 6. NovoLog sliding scale as before.  DIET:  Low salt, low cholesterol, 1800 calories ADA diet.  FOLLOWUP:  Follow up with me in 1 week.  Follow up with [facility] on July 14, 2013, at 2:15 p.m.  Follow up with me in 1 week.  CONDITION AT DISCHARGE:  Stable.  BRIEF HISTORY AND HOSPITAL COURSE:  Mr. Kauk is a 62 year old male with past medical history significant for hypertension, insulin- requiring diabetes mellitus, hypercholesteremia, history of tobacco abuse, history of alcohol abuse.  He  came to the ER complaining of retrosternal and epigastric pain described as pressure, associated with nausea for the last 5 days.  The patient states chest pain is localized and feels occasionally better with burping.  Denies any shortness of breath.  Denies palpitation, lightheadedness, or syncope.  The patient denies history of exertional chest pain.  EKG done in the ER initially showed sinus bradycardia with baseline artifact.  Repeat EKG showed sinus bradycardia with nonspecific ST-T wave changes and mild early repolarization changes.  The patient denies any palpitation, lightheadedness, or syncope.  Denies any recent alcohol abuse.  The patient denies any dark tarry stools.  Denies bright red blood per rectum.  Denies any fever or chills.  First set of troponin-I in the ED was negative.  The patient was noted to have significantly elevated lipase.  PAST MEDICAL HISTORY:  As above.  PHYSICAL EXAMINATION:  GENERAL:  He was alert, awake, oriented x3. VITAL SIGNS:  Blood pressure was 138/58, pulse was 54, he had a temp of 99.0. EYES:  Conjunctiva was pink. NECK:  Supple.  No JVD.  No bruits. LUNGS:  Clear to auscultation without rhonchi or rales. CARDIOVASCULAR:  S1, S2 was normal.  There was no murmur or gallop. ABDOMEN:  Soft.  Bowel sounds were normal.  It was mildly distended. There was mild epigastric tenderness.  No guarding or rebound. EXTREMITIES:  There was no clubbing, cyanosis.  There  was trace edema is noted. NEUROLOGIC:  Grossly intact.  LABORATORY DATA:  His sodium was 137, potassium 3.7, BUN 15, creatinine 1.12.  Glucose was 235.  Three sets of cardiac enzymes were normal. Cholesterol was 122, triglycerides 131, LDL 82, HDL was low at 14.  His hemoglobin was 13.6, hematocrit 37.9, white count of 5.0.  The patient had abdominal ultrasound, which showed large amount of gallbladder sludge with probable small gallstones.  Gallbladder was thick and consistent with  cholecystitis.  Murphy's sign was negative, this was likely chronic cholecystitis.  BRIEF HOSPITAL COURSE:  The patient was admitted to telemetry unit. Surgical consultation was obtained.  MI was ruled out by serial enzymes and EKG.  The patient subsequently underwent laparoscopic cholecystectomy.  The patient tolerated procedure well.  There were no complications.  The patient did not have any episodes of chest pain during the hospital stay.  The patient has remained afebrile during the hospital stay and has been ambulating in hallway without any problems. The patient's surgical scar sites are dry with no evidence of infection. The patient will be discharged home on above medications and will be followed up as above.  The patient will also follow up with Dr. Shana Chute as scheduled as an outpatient.     Eduardo Osier. Sharyn Lull, M.D.    [facility]/MEDQ  D:  07/04/2013  T:  07/05/2013  Job:  [id number]  cc:   Osvaldo Shipper. Spruill, M.D.

## 2013-07-07 ENCOUNTER — Encounter (HOSPITAL_COMMUNITY): Payer: Self-pay | Admitting: General Surgery

## 2013-07-14 ENCOUNTER — Encounter (INDEPENDENT_AMBULATORY_CARE_PROVIDER_SITE_OTHER): Payer: Medicare HMO

## 2014-03-22 ENCOUNTER — Other Ambulatory Visit (HOSPITAL_COMMUNITY): Payer: Self-pay | Admitting: Cardiology

## 2014-03-22 DIAGNOSIS — I209 Angina pectoris, unspecified: Secondary | ICD-10-CM

## 2014-03-22 DIAGNOSIS — R002 Palpitations: Secondary | ICD-10-CM

## 2014-03-31 ENCOUNTER — Encounter (HOSPITAL_COMMUNITY)
Admission: RE | Admit: 2014-03-31 | Discharge: 2014-03-31 | Disposition: A | Discharge status: Home / Self Care | Payer: Medicare HMO | Source: Ambulatory Visit | Attending: Cardiology | Admitting: Cardiology

## 2014-03-31 ENCOUNTER — Other Ambulatory Visit: Payer: Self-pay

## 2014-03-31 DIAGNOSIS — R002 Palpitations: Secondary | ICD-10-CM

## 2014-03-31 DIAGNOSIS — I209 Angina pectoris, unspecified: Secondary | ICD-10-CM | POA: Insufficient documentation

## 2014-03-31 MED ORDER — REGADENOSON 0.4 MG/5ML IV SOLN
INTRAVENOUS | Status: AC
  Filled 2014-03-31: qty 5

## 2014-03-31 MED ORDER — REGADENOSON 0.4 MG/5ML IV SOLN
0.4000 mg | Freq: Once | INTRAVENOUS | Status: AC
  Administered 2014-03-31: 0.4 mg via INTRAVENOUS

## 2014-03-31 MED ORDER — TECHNETIUM TC 99M SESTAMIBI GENERIC - CARDIOLITE
30.0000 | Freq: Once | INTRAVENOUS | Status: AC | PRN
  Administered 2014-03-31: 30 via INTRAVENOUS

## 2014-03-31 MED ORDER — TECHNETIUM TC 99M SESTAMIBI GENERIC - CARDIOLITE
10.0000 | Freq: Once | INTRAVENOUS | Status: AC | PRN
  Administered 2014-03-31: 10 via INTRAVENOUS

## 2014-04-28 ENCOUNTER — Encounter (HOSPITAL_COMMUNITY): Payer: Self-pay | Admitting: Emergency Medicine

## 2014-04-28 ENCOUNTER — Emergency Department (HOSPITAL_COMMUNITY)
Admission: EM | Admit: 2014-04-28 | Discharge: 2014-04-28 | Disposition: A | Discharge status: Home / Self Care | Payer: Medicare HMO | Attending: Emergency Medicine | Admitting: Emergency Medicine

## 2014-04-28 DIAGNOSIS — Z794 Long term (current) use of insulin: Secondary | ICD-10-CM | POA: Insufficient documentation

## 2014-04-28 DIAGNOSIS — Z79899 Other long term (current) drug therapy: Secondary | ICD-10-CM | POA: Diagnosis not present

## 2014-04-28 DIAGNOSIS — Z87891 Personal history of nicotine dependence: Secondary | ICD-10-CM | POA: Insufficient documentation

## 2014-04-28 DIAGNOSIS — I10 Essential (primary) hypertension: Secondary | ICD-10-CM | POA: Diagnosis not present

## 2014-04-28 DIAGNOSIS — Z7982 Long term (current) use of aspirin: Secondary | ICD-10-CM | POA: Diagnosis not present

## 2014-04-28 DIAGNOSIS — L02419 Cutaneous abscess of limb, unspecified: Secondary | ICD-10-CM | POA: Insufficient documentation

## 2014-04-28 DIAGNOSIS — E119 Type 2 diabetes mellitus without complications: Secondary | ICD-10-CM | POA: Insufficient documentation

## 2014-04-28 DIAGNOSIS — L03119 Cellulitis of unspecified part of limb: Secondary | ICD-10-CM | POA: Diagnosis not present

## 2014-04-28 DIAGNOSIS — E785 Hyperlipidemia, unspecified: Secondary | ICD-10-CM | POA: Diagnosis not present

## 2014-04-28 DIAGNOSIS — L0291 Cutaneous abscess, unspecified: Secondary | ICD-10-CM

## 2014-04-28 MED ORDER — OXYCODONE-ACETAMINOPHEN 5-325 MG PO TABS: 1.0000 | ORAL_TABLET | Freq: Four times a day (QID) | ORAL | Status: DC | PRN

## 2014-04-28 NOTE — ED Provider Notes (Signed)
Patient had abscess at left groin onset 3 days ago. History diabetes, adult onset. Reports Blood sugar this morning 219 Abscess I&D here. Alert, not ill appearing .Patient with dime sized abscess at left proximal thigh.  Doug SouSam Glorie Dowlen, MD 04/28/14 [phone]

## 2014-04-28 NOTE — ED Provider Notes (Signed)
CSN: [id number]     Arrival date & time 04/28/14  1311 History   First MD Initiated Contact with Patient 04/28/14 1504     Chief Complaint  Patient presents with  . Abscess     (Consider location/radiation/quality/duration/timing/severity/associated sxs/prior Treatment) HPI Comments: [name] is a 63 y.o. male past medical history of diabetes, HTN, HLD, and necrotizing fasciitis and Fournier's gangrene, presenting today with 3 days of a left upper thigh abscess. States that the area has become harder and larger and more painful, but denies any fevers, chills, red streaking, draining, redness, or warmth. Has not tried anything for at home, but does endorse that pushing on it hurts worse. Denies any headaches, chest pain, shortness of breath, abdominal pain, nausea, vomiting, diarrhea, dysuria, hematuria, penile swelling or discharge, testicular swelling or pain, myalgias, arthralgias, or paresthesias. Took his blood sugar at home this morning, and states that it was 219. Endorses compliance with all of his medications. He has a PCP but he only works until Wednesdays.  Patient is a 63 y.o. male presenting with abscess. The history is provided by the patient. No language interpreter was used.  Abscess Location:  Leg Leg abscess location:  L upper leg Size:  Half-dollar Abscess quality: induration and painful   Abscess quality: not draining, no redness, no warmth and not weeping   Red streaking: no   Duration:  3 days Progression:  Worsening Pain details:    Quality:  Pressure   Severity:  Mild   Duration:  3 days   Timing:  Constant   Progression:  Unchanged Chronicity:  Recurrent Context: diabetes   Relieved by:  Nothing Worsened by:  Draining/squeezing Ineffective treatments:  None tried Associated symptoms: no fever, no nausea and no vomiting   Risk factors: prior abscess     Past Medical History  Diagnosis Date  . DM (diabetes mellitus)   . HTN (hypertension)   .  Hyperlipemia    Past Surgical History  Procedure Laterality Date  . Colon surgery    . Diverting colostomy and i&d fourniers  04/10/02    Dr. Jimmye Norman  . Skin graft  05/08/02    Dr. Odis Luster  . Reversal of colostomy  07/27/02     Dr. Jimmye Norman  . Cholecystectomy N/A 07/02/2013    Procedure: LAPAROSCOPIC CHOLECYSTECTOMY WITH INTRAOPERATIVE CHOLANGIOGRAM ;  Surgeon: Atilano Ina, MD;  Location: [facility] OR;  Service: General;  Laterality: N/A;   Family History  Problem Relation Age of Onset  . COPD Mother   . Lung cancer Father   . Diabetes Paternal Uncle   . Hearing loss Paternal Uncle    History  Substance Use Topics  . Smoking status: Former Smoker -- 3.00 packs/day    Types: Cigarettes    Quit date: 03/08/2004  . Smokeless tobacco: Never Used  . Alcohol Use: No    Review of Systems  Constitutional: Negative for fever and chills.  Respiratory: Negative for shortness of breath.   Cardiovascular: Negative for chest pain and leg swelling.  Gastrointestinal: Negative for nausea, vomiting, abdominal pain and diarrhea.  Genitourinary: Negative for dysuria, urgency, discharge, penile swelling, penile pain and testicular pain.  Musculoskeletal: Negative for arthralgias, joint swelling and myalgias.  Skin: Positive for wound.  Neurological: Negative for dizziness and weakness.  Hematological: Does not bruise/bleed easily.  Psychiatric/Behavioral: Negative for confusion.      Allergies  Review of patient's allergies indicates no known allergies.  Home Medications   Prior to  Admission medications   Medication Sig Start Date End Date Taking? Authorizing Provider  aspirin EC 81 MG EC tablet Take 1 tablet (81 mg total) by mouth daily. 07/04/13   Robynn Pane, MD  atorvastatin (LIPITOR) 20 MG tablet Take 20 mg by mouth daily.    Historical Provider, MD  insulin aspart (NOVOLOG FLEXPEN) 100 UNIT/ML SOPN FlexPen Inject 5-17 Units into the skin as directed. CBG 130-150:5units,  151-200:7units, 201-250:10units, 251-300:12units, 301-350:15units, 351-400:17units    Historical Provider, MD  Insulin Glargine (LANTUS SOLOSTAR) 100 UNIT/ML SOPN Inject 30 Units into the skin at bedtime.    Historical Provider, MD  levothyroxine (SYNTHROID, LEVOTHROID) 100 MCG tablet Take 100 mcg by mouth daily before breakfast.    Historical Provider, MD  lisinopril-hydrochlorothiazide (PRINZIDE,ZESTORETIC) 20-12.5 MG per tablet Take 1 tablet by mouth daily.    Historical Provider, MD  losartan (COZAAR) 50 MG tablet Take 50 mg by mouth daily.    Historical Provider, MD  nitroGLYCERIN (NITROSTAT) 0.4 MG SL tablet Place 1 tablet (0.4 mg total) under the tongue every 5 (five) minutes x 3 doses as needed for chest pain. 07/04/13   Robynn Pane, MD  oxyCODONE-acetaminophen (PERCOCET) 5-325 MG per tablet Take 1-2 tablets by mouth every 6 (six) hours as needed for severe pain. 04/28/14   Sergey Ishler Strupp Camprubi-Soms, PA-C   BP 131/71  Pulse 62  Temp(Src) 98.1 F (36.7 C) (Oral)  Resp 20  Ht 5\' 5"  (1.651 m)  Wt 205 lb (92.987 kg)  BMI 34.11 kg/m2  SpO2 97% Physical Exam  Nursing note and vitals reviewed. Constitutional: He is oriented to person, place, and time. Vital signs are normal. He appears well-developed and well-nourished.  Non-toxic appearance. No distress.  VSS, afebrile, WDWN, nontoxic and in NAD  HENT:  Head: Normocephalic and atraumatic.  Mouth/Throat: Mucous membranes are normal.  Eyes: Conjunctivae and EOM are normal. Right eye exhibits no discharge. Left eye exhibits no discharge.  Neck: Normal range of motion. Neck supple.  Cardiovascular: Normal rate and intact distal pulses.   Pulmonary/Chest: Effort normal.  Abdominal: Normal appearance. He exhibits no distension.  Genitourinary: Testes normal and penis normal. Right testis shows no swelling and no tenderness. Left testis shows no swelling and no tenderness.  No testicular or penile swelling or TTP.  Musculoskeletal:  Normal range of motion.  Lymphadenopathy:       Left: No inguinal adenopathy present.  Neurological: He is alert and oriented to person, place, and time. He has normal strength.  Skin: Skin is warm, dry and intact. Lesion noted. No erythema.     6-7cm area of induration on L upper medial thigh, near inguinal fold, with central fluctuance approx 2-3cm in diameter. No LAD. No erythema, drainage, puncture wound, or warmth.  Psychiatric: He has a normal mood and affect.    ED Course  INCISION AND DRAINAGE Date/Time: 04/28/2014 3:45 PM Performed by: Marjean Donna, Penny Arrambide STRUPP Authorized by: Ramond Marrow Consent: Verbal consent obtained. Risks and benefits: risks, benefits and alternatives were discussed Consent given by: patient Patient understanding: patient states understanding of the procedure being performed Patient consent: the patient's understanding of the procedure matches consent given Patient identity confirmed: verbally with patient Type: abscess Body area: lower extremity Location details: left leg Anesthesia: local infiltration Local anesthetic: lidocaine 2% without epinephrine Anesthetic total: 1 ml Patient sedated: no Scalpel size: 11 Needle gauge: 21. Incision type: single straight Complexity: simple Drainage: purulent Drainage amount: moderate Packing material: 1/4 in iodoform gauze   (  including critical care time) Labs Review Labs Reviewed  WOUND CULTURE    Imaging Review No results found.   EKG Interpretation None      MDM   Final diagnoses:  Abscess   Jonathan BonMatthew House is a 63 y.o. male with a PMHx of DM, HTN, HLD, and nec fasc and fournier's gangrene, presenting with a left thigh abscess. Pt afebrile and nontoxic. Approximately 6 cm of induration with central fluctuance, no erythema, no warmth, no drainage. Area was I&D, with purulent material drained and sent for culture. Area was packed and dressed.  Discussed using pain  medication appropriately, as well as using warm compresses to the area. Discussed following up with urgent care in 2 days for packing recheck. Will defer giving antibiotics at this time, per Dr. Ethelda ChickJacubowitz. Given strict return precautions, and discussed signs of furthering infection, which would prompt a possible need for antibiotics. Rx for percocet given, area dressed with guaze. I explained the diagnosis and have given explicit precautions to return to the ER including for any other new or worsening symptoms. The patient understands and accepts the medical plan as it's been dictated and I have answered their questions. Discharge instructions concerning home care and prescriptions have been given. The patient is STABLE and is discharged to home in good condition.  BP 131/71  Pulse 62  Temp(Src) 98.1 F (36.7 C) (Oral)  Resp 20  Ht 5\' 5"  (1.651 m)  Wt 205 lb (92.987 kg)  BMI 34.11 kg/m2  SpO2 97%     Celanese CorporationMercedes Strupp Camprubi-Soms, PA-C 04/28/14 1635

## 2014-04-28 NOTE — Discharge Instructions (Signed)
Keep wound clean and dry. Apply warm compresses to affected area throughout the day. Take Percocet as directed, as needed for pain but do not drive or operate machinery with pain medication use. [facility] for wound recheck and packing removal.  Return to emergency department for emergent changing or worsening symptoms.   Abscess Care After An abscess (also called a boil or furuncle) is an infected area that contains a collection of pus. Signs and symptoms of an abscess include pain, tenderness, redness, or hardness, or you may feel a moveable soft area under your skin. An abscess can occur anywhere in the body. The infection may spread to surrounding tissues causing cellulitis. A cut (incision) by the surgeon was made over your abscess and the pus was drained out. Gauze may have been packed into the space to provide a drain that will allow the cavity to heal from the inside outwards. The boil may be painful for 5 to 7 days. Most people with a boil do not have high fevers. Your abscess, if seen early, may not have localized, and may not have been lanced. If not, another appointment may be required for this if it does not get better on its own or with medications. HOME CARE INSTRUCTIONS   Only take over-the-counter or prescription medicines for pain, discomfort, or fever as directed by your caregiver.  When you bathe, soak and then remove gauze or iodoform packs at least daily or as directed by your caregiver. You may then wash the wound gently with mild soapy water. Repack with gauze or do as your caregiver directs. SEEK IMMEDIATE MEDICAL CARE IF:   You develop increased pain, swelling, redness, drainage, or bleeding in the wound site.  You develop signs of generalized infection including muscle aches, chills, fever, or a general ill feeling.  An oral temperature above 102 F (38.9 C) develops, not controlled by medication. See your caregiver  for a recheck if you develop any of the symptoms described above. If medications (antibiotics) were prescribed, take them as directed. Document Released: 04/12/2005 Document Revised: 12/17/2011 Document Reviewed: 12/08/2007 [facility] Patient Information 2015 [employer] [place]. This information is not intended to replace advice given to you by your health care provider. Make sure you discuss any questions you have with your health care provider.

## 2014-04-28 NOTE — ED Notes (Signed)
Per pt sts abscess to inside thigh. sts hx of same and area became gangrenous. sts has been there 3 days and getting worse.

## 2014-04-28 NOTE — ED Provider Notes (Signed)
Medical screening examination/treatment/procedure(s) were conducted as a shared visit with non-physician practitioner(s) and myself.  I personally evaluated the patient during the encounter.   EKG Interpretation None       Doug SouSam Lakeyn Dokken, MD 04/28/14 2337

## 2014-04-30 LAB — WOUND CULTURE: CULTURE: NO GROWTH

## 2014-05-01 ENCOUNTER — Encounter (HOSPITAL_COMMUNITY): Payer: Self-pay | Admitting: Emergency Medicine

## 2014-05-01 ENCOUNTER — Emergency Department (INDEPENDENT_AMBULATORY_CARE_PROVIDER_SITE_OTHER)
Admission: EM | Admit: 2014-05-01 | Discharge: 2014-05-01 | Disposition: A | Discharge status: Home / Self Care | Payer: Medicare HMO | Source: Home / Self Care | Attending: Family Medicine | Admitting: Family Medicine

## 2014-05-01 DIAGNOSIS — Z5189 Encounter for other specified aftercare: Secondary | ICD-10-CM

## 2014-05-01 DIAGNOSIS — Z4800 Encounter for change or removal of nonsurgical wound dressing: Secondary | ICD-10-CM

## 2014-05-01 NOTE — ED Notes (Signed)
Pt  Is  Here  For  A  Wound  Check   And  For  Possible  Packing  Removal  Seen  Recently  Er       Had  I nd  D  Of  Upper thigh

## 2014-05-01 NOTE — Discharge Instructions (Signed)
Care as discussed, return if any problems °

## 2014-05-01 NOTE — ED Provider Notes (Signed)
CSN: [id number]     Arrival date & time 05/01/14  1128 History   First MD Initiated Contact with Patient 05/01/14 1150     Chief Complaint  Patient presents with  . Wound Check   (Consider location/radiation/quality/duration/timing/severity/associated sxs/prior Treatment) Patient is a 63 y.o. male presenting with wound check. The history is provided by the patient.  Wound Check This is a new problem. The current episode started more than 2 days ago (seen 7/22 in ER for i+d of left thigh abscess, no problems , doing well.). The problem has not changed since onset.   Past Medical History  Diagnosis Date  . DM (diabetes mellitus)   . HTN (hypertension)   . Hyperlipemia    Past Surgical History  Procedure Laterality Date  . Colon surgery    . Diverting colostomy and i&d fourniers  04/10/02    Dr. Jimmye NormanJames Wyatt  . Skin graft  05/08/02    Dr. Odis LusterBowers  . Reversal of colostomy  07/27/02     Dr. Jimmye NormanJames Wyatt  . Cholecystectomy N/A 07/02/2013    Procedure: LAPAROSCOPIC CHOLECYSTECTOMY WITH INTRAOPERATIVE CHOLANGIOGRAM ;  Surgeon: Atilano InaEric M Wilson, MD;  [facility];  Service: General;  Laterality: N/A;   Family History  Problem Relation Age of Onset  . COPD Mother   . Lung cancer Father   . Diabetes Paternal Uncle   . Hearing loss Paternal Uncle    History  Substance Use Topics  . Smoking status: Former Smoker -- 3.00 packs/day    Types: Cigarettes    Quit date: 03/08/2004  . Smokeless tobacco: Never Used  . Alcohol Use: No    Review of Systems  Allergies  Review of patient's allergies indicates no known allergies.  Home Medications   Prior to Admission medications   Medication Sig Start Date End Date Taking? Authorizing Provider  aspirin EC 81 MG EC tablet Take 1 tablet (81 mg total) by mouth daily. 07/04/13   Robynn PaneMohan N Harwani, MD  atorvastatin (LIPITOR) 20 MG tablet Take 20 mg by mouth daily.    Historical Provider, MD  insulin aspart (NOVOLOG FLEXPEN) 100 UNIT/ML SOPN FlexPen  Inject 5-17 Units into the skin as directed. CBG 130-150:5units, 151-200:7units, 201-250:10units, 251-300:12units, 301-350:15units, 351-400:17units    Historical Provider, MD  Insulin Glargine (LANTUS SOLOSTAR) 100 UNIT/ML SOPN Inject 30 Units into the skin at bedtime.    Historical Provider, MD  levothyroxine (SYNTHROID, LEVOTHROID) 100 MCG tablet Take 100 mcg by mouth daily before breakfast.    Historical Provider, MD  lisinopril-hydrochlorothiazide (PRINZIDE,ZESTORETIC) 20-12.5 MG per tablet Take 1 tablet by mouth daily.    Historical Provider, MD  losartan (COZAAR) 50 MG tablet Take 50 mg by mouth daily.    Historical Provider, MD  nitroGLYCERIN (NITROSTAT) 0.4 MG SL tablet Place 1 tablet (0.4 mg total) under the tongue every 5 (five) minutes x 3 doses as needed for chest pain. 07/04/13   Robynn PaneMohan N Harwani, MD  oxyCODONE-acetaminophen (PERCOCET) 5-325 MG per tablet Take 1-2 tablets by mouth every 6 (six) hours as needed for severe pain. 04/28/14   Mercedes Strupp Camprubi-Soms, PA-C   BP 139/78  Pulse 88  Temp(Src) 98 F (36.7 C) (Oral)  SpO2 97% Physical Exam  Nursing note and vitals reviewed. Constitutional: He is oriented to person, place, and time. He appears well-developed and well-nourished.  Neurological: He is alert and oriented to person, place, and time.  Skin: Skin is warm and dry.  Packing removed, no drainage.nontender.    ED  Course  Procedures (including critical care time) Labs Review Labs Reviewed - No data to display  Imaging Review No results found.   MDM   1. Encounter for wound re-check        Linna Hoff, MD 05/01/14 [phone]

## 2014-09-10 ENCOUNTER — Observation Stay (HOSPITAL_COMMUNITY)
Admission: EM | Admit: 2014-09-10 | Discharge: 2014-09-10 | Disposition: A | Discharge status: Home / Self Care | Payer: Medicare HMO | Attending: Cardiovascular Disease | Admitting: Cardiovascular Disease

## 2014-09-10 ENCOUNTER — Emergency Department (HOSPITAL_COMMUNITY): Payer: Medicare HMO

## 2014-09-10 ENCOUNTER — Other Ambulatory Visit: Payer: Self-pay

## 2014-09-10 ENCOUNTER — Encounter (HOSPITAL_COMMUNITY): Payer: Self-pay | Admitting: Emergency Medicine

## 2014-09-10 DIAGNOSIS — Z794 Long term (current) use of insulin: Secondary | ICD-10-CM | POA: Insufficient documentation

## 2014-09-10 DIAGNOSIS — I10 Essential (primary) hypertension: Secondary | ICD-10-CM | POA: Diagnosis not present

## 2014-09-10 DIAGNOSIS — E785 Hyperlipidemia, unspecified: Secondary | ICD-10-CM | POA: Diagnosis not present

## 2014-09-10 DIAGNOSIS — R51 Headache: Secondary | ICD-10-CM | POA: Diagnosis not present

## 2014-09-10 DIAGNOSIS — Z87891 Personal history of nicotine dependence: Secondary | ICD-10-CM | POA: Insufficient documentation

## 2014-09-10 DIAGNOSIS — R55 Syncope and collapse: Principal | ICD-10-CM | POA: Diagnosis present

## 2014-09-10 DIAGNOSIS — Z7982 Long term (current) use of aspirin: Secondary | ICD-10-CM | POA: Diagnosis not present

## 2014-09-10 DIAGNOSIS — E119 Type 2 diabetes mellitus without complications: Secondary | ICD-10-CM | POA: Diagnosis not present

## 2014-09-10 DIAGNOSIS — Z6833 Body mass index (BMI) 33.0-33.9, adult: Secondary | ICD-10-CM | POA: Diagnosis not present

## 2014-09-10 DIAGNOSIS — I252 Old myocardial infarction: Secondary | ICD-10-CM | POA: Insufficient documentation

## 2014-09-10 DIAGNOSIS — E860 Dehydration: Secondary | ICD-10-CM | POA: Insufficient documentation

## 2014-09-10 DIAGNOSIS — E669 Obesity, unspecified: Secondary | ICD-10-CM | POA: Insufficient documentation

## 2014-09-10 DIAGNOSIS — R519 Headache, unspecified: Secondary | ICD-10-CM

## 2014-09-10 HISTORY — DX: Acute myocardial infarction, unspecified: I21.9

## 2014-09-10 LAB — CREATININE, SERUM
CREATININE: 1.29 mg/dL (ref 0.50–1.35)
GFR, EST AFRICAN AMERICAN: 68 mL/min — AB (ref >90)
GFR, EST NON AFRICAN AMERICAN: 58 mL/min — AB (ref >90)

## 2014-09-10 LAB — COMPREHENSIVE METABOLIC PANEL
ALK PHOS: 62 U/L (ref 39–117)
ALT: 21 U/L (ref 0–53)
AST: 20 U/L (ref 0–37)
Albumin: 4 g/dL (ref 3.5–5.2)
Anion gap: 16 — ABNORMAL HIGH (ref 5–15)
BILIRUBIN TOTAL: 0.2 mg/dL — AB (ref 0.3–1.2)
BUN: 18 mg/dL (ref 6–23)
CHLORIDE: 98 meq/L (ref 96–112)
CO2: 27 mEq/L (ref 19–32)
Calcium: 9.7 mg/dL (ref 8.4–10.5)
Creatinine, Ser: 1.51 mg/dL — ABNORMAL HIGH (ref 0.50–1.35)
GFR, EST AFRICAN AMERICAN: 56 mL/min — AB (ref >90)
GFR, EST NON AFRICAN AMERICAN: 48 mL/min — AB (ref >90)
GLUCOSE: 206 mg/dL — AB (ref 70–99)
POTASSIUM: 3.8 meq/L (ref 3.7–5.3)
Sodium: 141 mEq/L (ref 137–147)
Total Protein: 7.3 g/dL (ref 6.0–8.3)

## 2014-09-10 LAB — CBC
HCT: 38.3 % — ABNORMAL LOW (ref 39.0–52.0)
HEMATOCRIT: 36.6 % — AB (ref 39.0–52.0)
HEMOGLOBIN: 12.1 g/dL — AB (ref 13.0–17.0)
Hemoglobin: 13 g/dL (ref 13.0–17.0)
MCH: 26.7 pg (ref 26.0–34.0)
MCH: 25.9 pg — ABNORMAL LOW (ref 26.0–34.0)
MCHC: 33.9 g/dL (ref 30.0–36.0)
MCHC: 33.1 g/dL (ref 30.0–36.0)
MCV: 78.8 fL (ref 78.0–100.0)
MCV: 78.2 fL (ref 78.0–100.0)
PLATELETS: 196 10*3/uL (ref 150–400)
Platelets: 177 10*3/uL (ref 150–400)
RBC: 4.86 MIL/uL (ref 4.22–5.81)
RBC: 4.68 MIL/uL (ref 4.22–5.81)
RDW: 12.8 % (ref 11.5–15.5)
RDW: 12.9 % (ref 11.5–15.5)
WBC: 6.7 10*3/uL (ref 4.0–10.5)
WBC: 6 10*3/uL (ref 4.0–10.5)

## 2014-09-10 LAB — HEMOGLOBIN A1C
Hgb A1c MFr Bld: 12.1 % — ABNORMAL HIGH (ref <5.7)
Mean Plasma Glucose: 301 mg/dL — ABNORMAL HIGH (ref <117)

## 2014-09-10 LAB — GLUCOSE, CAPILLARY: Glucose-Capillary: 361 mg/dL — ABNORMAL HIGH (ref 70–99)

## 2014-09-10 LAB — CK: Total CK: 221 U/L (ref 7–232)

## 2014-09-10 MED ORDER — ONDANSETRON HCL 4 MG PO TABS: 4.0000 mg | ORAL_TABLET | Freq: Four times a day (QID) | ORAL | Status: DC | PRN

## 2014-09-10 MED ORDER — INSULIN ASPART 100 UNIT/ML ~~LOC~~ SOLN
0.0000 [IU] | Freq: Three times a day (TID) | SUBCUTANEOUS | Status: DC
  Administered 2014-09-10: 15 [IU] via SUBCUTANEOUS

## 2014-09-10 MED ORDER — OXYCODONE-ACETAMINOPHEN 5-325 MG PO TABS: 1.0000 | ORAL_TABLET | Freq: Four times a day (QID) | ORAL | Status: DC | PRN

## 2014-09-10 MED ORDER — SODIUM CHLORIDE 0.9 % IV SOLN
INTRAVENOUS | Status: DC
  Administered 2014-09-10: 14:00:00 via INTRAVENOUS

## 2014-09-10 MED ORDER — HEPARIN SODIUM (PORCINE) 5000 UNIT/ML IJ SOLN
5000.0000 [IU] | Freq: Three times a day (TID) | INTRAMUSCULAR | Status: DC
  Administered 2014-09-10: 5000 [IU] via SUBCUTANEOUS
  Filled 2014-09-10: qty 1

## 2014-09-10 MED ORDER — ALUM & MAG HYDROXIDE-SIMETH 200-200-20 MG/5ML PO SUSP: 30.0000 mL | Freq: Four times a day (QID) | ORAL | Status: DC | PRN

## 2014-09-10 MED ORDER — ADULT MULTIVITAMIN W/MINERALS CH
1.0000 | ORAL_TABLET | Freq: Every day | ORAL | Status: DC
  Administered 2014-09-10: 1 via ORAL
  Filled 2014-09-10: qty 1

## 2014-09-10 MED ORDER — DOCUSATE SODIUM 100 MG PO CAPS
100.0000 mg | ORAL_CAPSULE | Freq: Two times a day (BID) | ORAL | Status: DC
  Administered 2014-09-10: 100 mg via ORAL
  Filled 2014-09-10: qty 1

## 2014-09-10 MED ORDER — SODIUM CHLORIDE 0.9 % IV BOLUS (SEPSIS)
1000.0000 mL | Freq: Once | INTRAVENOUS | Status: AC
  Administered 2014-09-10: 1000 mL via INTRAVENOUS

## 2014-09-10 MED ORDER — ONDANSETRON HCL 4 MG/2ML IJ SOLN
4.0000 mg | Freq: Four times a day (QID) | INTRAMUSCULAR | Status: DC | PRN
Start: 2014-09-10 — End: 2014-09-10

## 2014-09-10 MED ORDER — ATORVASTATIN CALCIUM 20 MG PO TABS
20.0000 mg | ORAL_TABLET | Freq: Every day | ORAL | Status: DC
  Administered 2014-09-10: 20 mg via ORAL
  Filled 2014-09-10: qty 1

## 2014-09-10 MED ORDER — ASPIRIN EC 81 MG PO TBEC
81.0000 mg | DELAYED_RELEASE_TABLET | Freq: Every day | ORAL | Status: DC
  Filled 2014-09-10 (×2): qty 1

## 2014-09-10 MED ORDER — SODIUM CHLORIDE 0.9 % IV BOLUS (SEPSIS): 1000.0000 mL | Freq: Once | INTRAVENOUS | Status: DC

## 2014-09-10 MED ORDER — LEVOTHYROXINE SODIUM 100 MCG PO TABS
100.0000 ug | ORAL_TABLET | Freq: Every day | ORAL | Status: DC
  Administered 2014-09-10: 100 ug via ORAL
  Filled 2014-09-10 (×2): qty 1

## 2014-09-10 MED ORDER — INSULIN GLARGINE 100 UNIT/ML ~~LOC~~ SOLN
30.0000 [IU] | Freq: Every day | SUBCUTANEOUS | Status: DC
  Filled 2014-09-10: qty 0.3

## 2014-09-10 NOTE — Plan of Care (Signed)
Problem: Phase I Progression Outcomes Goal: Hemodynamically stable Outcome: Completed/Met Date Met:  09/10/14

## 2014-09-10 NOTE — Progress Notes (Signed)
Reviewed dc instructions with patient and his wife, verbalized understanding. Patient denies discomfort. DC home with instructions and belongings. States ready to go home [name], [name]

## 2014-09-10 NOTE — ED Provider Notes (Signed)
CSN: [id number]     Arrival date & time 09/10/14  [id number] History   First MD Initiated Contact with Patient 09/10/14 0510     Chief Complaint  Patient presents with  . Loss of Consciousness     (Consider location/radiation/quality/duration/timing/severity/associated sxs/prior Treatment) HPI Comments: Patient from home after episode of syncope. States he woke up with a severe cramp in his left upper leg. Remembers sitting on the side of the bed and next thing he knows he was on the ground. Wife says he passed out and slipped to the ground and was unconscious for about 5 minutes. On EMS arrival he was diaphoretic and speech was garbled. He is now alert and oriented. He does not remember feeling dizzy or lightheaded prior to passing out. EMS states he vomited twice in route. His wife says he was incontinent of urine. No tongue biting or seizure activity noted.  The history is provided by the patient, the EMS personnel and a relative.    Past Medical History  Diagnosis Date  . DM (diabetes mellitus)   . HTN (hypertension)   . Hyperlipemia   . Heart attack    Past Surgical History  Procedure Laterality Date  . Colon surgery    . Diverting colostomy and i&d fourniers  04/10/02    Dr. Jimmye NormanJames Wyatt  . Skin graft  05/08/02    Dr. Odis LusterBowers  . Reversal of colostomy  07/27/02     Dr. Jimmye NormanJames Wyatt  . Cholecystectomy N/A 07/02/2013    Procedure: LAPAROSCOPIC CHOLECYSTECTOMY WITH INTRAOPERATIVE CHOLANGIOGRAM ;  Surgeon: Atilano InaEric M Wilson, MD;  [facility];  Service: General;  Laterality: N/A;   Family History  Problem Relation Age of Onset  . COPD Mother   . Lung cancer Father   . Diabetes Paternal Uncle   . Hearing loss Paternal Uncle    History  Substance Use Topics  . Smoking status: Former Smoker -- 3.00 packs/day    Types: Cigarettes    Quit date: 03/08/2004  . Smokeless tobacco: Never Used  . Alcohol Use: No    Review of Systems  Constitutional: Negative for fever, activity change and  appetite change.  Respiratory: Negative for cough, chest tightness and shortness of breath.   Cardiovascular: Positive for syncope. Negative for chest pain.  Gastrointestinal: Negative for nausea, vomiting and abdominal pain.  Genitourinary: Negative for dysuria and hematuria.  Musculoskeletal: Positive for myalgias and arthralgias.  Skin: Negative for rash.  Neurological: Positive for syncope and headaches. Negative for dizziness and light-headedness.  A complete 10 system review of systems was obtained and all systems are negative except as noted in the HPI and PMH.      Allergies  Review of patient's allergies indicates no known allergies.  Home Medications   Prior to Admission medications   Medication Sig Start Date End Date Taking? Authorizing Provider  aspirin EC 81 MG EC tablet Take 1 tablet (81 mg total) by mouth daily. 07/04/13  Yes Robynn PaneMohan N Harwani, MD  atorvastatin (LIPITOR) 20 MG tablet Take 20 mg by mouth daily.   Yes Historical Provider, MD  insulin aspart (NOVOLOG FLEXPEN) 100 UNIT/ML SOPN FlexPen Inject 5-17 Units into the skin as directed. CBG 130-150:5units, 151-200:7units, 201-250:10units, 251-300:12units, 301-350:15units, 351-400:17units   Yes Historical Provider, MD  Insulin Glargine (LANTUS SOLOSTAR) 100 UNIT/ML SOPN Inject 30 Units into the skin at bedtime.   Yes Historical Provider, MD  levothyroxine (SYNTHROID, LEVOTHROID) 100 MCG tablet Take 100 mcg by mouth daily before breakfast.  Yes Historical Provider, MD  lisinopril-hydrochlorothiazide (PRINZIDE,ZESTORETIC) 20-12.5 MG per tablet Take 1 tablet by mouth daily.   Yes Historical Provider, MD  losartan (COZAAR) 50 MG tablet Take 50 mg by mouth daily.   Yes Historical Provider, MD  nitroGLYCERIN (NITROSTAT) 0.4 MG SL tablet Place 1 tablet (0.4 mg total) under the tongue every 5 (five) minutes x 3 doses as needed for chest pain. 07/04/13  Yes Robynn PaneMohan N Harwani, MD  oxyCODONE-acetaminophen (PERCOCET) 5-325 MG per  tablet Take 1-2 tablets by mouth every 6 (six) hours as needed for severe pain. Patient not taking: Reported on 09/10/2014 04/28/14   Donnita FallsMercedes Strupp Camprubi-Soms, PA-C   BP 127/47 mmHg  Pulse 51  Temp(Src) 97.7 F (36.5 C) (Oral)  Resp 18  Ht 5\' 5"  (1.651 m)  Wt 199 lb 8.3 oz (90.5 kg)  BMI 33.20 kg/m2  SpO2 98% Physical Exam  Constitutional: He is oriented to person, place, and time. He appears well-developed and well-nourished. No distress.  HENT:  Head: Normocephalic and atraumatic.  Mouth/Throat: Oropharynx is clear and moist. No oropharyngeal exudate.  Eyes: Conjunctivae and EOM are normal. Pupils are equal, round, and reactive to light.  Neck: Normal range of motion. Neck supple.  No meningismus.  Cardiovascular: Normal rate, regular rhythm, normal heart sounds and intact distal pulses.   No murmur heard. Pulmonary/Chest: Effort normal and breath sounds normal. No respiratory distress.  Abdominal: Soft. There is no tenderness. There is no rebound and no guarding.  Musculoskeletal: Normal range of motion. He exhibits no edema or tenderness.  Neurological: He is alert and oriented to person, place, and time. No cranial nerve deficit. He exhibits normal muscle tone. Coordination normal.  No ataxia on finger to nose bilaterally. No pronator drift. 5/5 strength throughout. CN 2-12 intact. Negative Romberg. Equal grip strength. Sensation intact. Gait is normal.   Skin: Skin is warm.  Psychiatric: He has a normal mood and affect. His behavior is normal.  Nursing note and vitals reviewed.   ED Course  Procedures (including critical care time) Labs Review Labs Reviewed  COMPREHENSIVE METABOLIC PANEL - Abnormal; Notable for the following:    Glucose, Bld 206 (*)    Creatinine, Ser 1.51 (*)    Total Bilirubin 0.2 (*)    GFR calc non Af Amer 48 (*)    GFR calc Af Amer 56 (*)    Anion gap 16 (*)    All other components within normal limits  CBC - Abnormal; Notable for the  following:    HCT 38.3 (*)    All other components within normal limits  CBC - Abnormal; Notable for the following:    Hemoglobin 12.1 (*)    HCT 36.6 (*)    MCH 25.9 (*)    All other components within normal limits  CREATININE, SERUM - Abnormal; Notable for the following:    GFR calc non Af Amer 58 (*)    GFR calc Af Amer 68 (*)    All other components within normal limits  GLUCOSE, CAPILLARY - Abnormal; Notable for the following:    Glucose-Capillary 361 (*)    All other components within normal limits  CK  HEMOGLOBIN A1C  BASIC METABOLIC PANEL  CBC  I-STAT TROPOININ, ED    Imaging Review Dg Chest 2 View  09/10/2014   CLINICAL DATA:  Acute onset of headache. Left leg cramp, and chest tightness and dizziness. Current history of diabetes and personal history of smoking. Initial encounter.  EXAM: CHEST  2  VIEW  COMPARISON:  Chest radiograph performed 06/30/2013  FINDINGS: The lungs are well-aerated and clear. There is no evidence of focal opacification, pleural effusion or pneumothorax.  The cardiomediastinal silhouette is within normal limits. No acute osseous abnormalities are seen.  IMPRESSION: No acute cardiopulmonary process seen.   Electronically Signed   By: Roanna Raider M.D.   On: 09/10/2014 06:29   Ct Head Wo Contrast  09/10/2014   CLINICAL DATA:  Initial evaluation for headache, garbled speech.  EXAM: CT HEAD WITHOUT CONTRAST  TECHNIQUE: Contiguous axial images were obtained from the base of the skull through the vertex without intravenous contrast.  COMPARISON:  Prior CT from 02/22/2008.  FINDINGS: Mild age-related atrophy with chronic microvascular ischemic disease present.  There is no acute intracranial hemorrhage or infarct. No mass lesion or midline shift. Gray-white matter differentiation is well maintained. Ventricles are normal in size without evidence of hydrocephalus. CSF containing spaces are within normal limits. No extra-axial fluid collection.  The calvarium is  intact.  Orbital soft tissues are within normal limits.  The paranasal sinuses and mastoid air cells are well pneumatized and free of fluid.  Scalp soft tissues are unremarkable.  IMPRESSION: 1. No acute intracranial abnormality. 2. Mild atrophy with chronic microvascular ischemic disease.   Electronically Signed   By: Rise Mu M.D.   On: 09/10/2014 06:22     EKG Interpretation None      MDM   Final diagnoses:  Headache  Syncope, unspecified syncope type    Syncopal episode after severe leg cramp.  Associated with diaphoresis and garbled speech.  No Chest pain or SOB.Denies thunderclap onset headache, headache is now mild.  EKG unchanged, nonfocal neuro exam. No hypoglycemia.  Slight AKI. IVF given. Orthostatics negative.  Sinus bradycardia in ED. Does not know home meds, but no beta blockers listed. Given risk factors and lack of prodrome for syncope, will observe. D/w Dr. Algie Coffer.   Date: 09/10/2014  Rate: 51  Rhythm: normal sinus rhythm  QRS Axis: normal  Intervals: normal  ST/T Wave abnormalities: nonspecific ST/T changes  Conduction Disutrbances:none  Narrative Interpretation:   Old EKG Reviewed: none available    Glynn Octave, MD 09/10/14 1732

## 2014-09-10 NOTE — Discharge Summary (Signed)
Physician Discharge Summary  Patient ID: [name] MRN: [id number] DOB/AGE: 1951/08/26 63 y.o.  Admit date: 09/10/2014 Discharge date: 09/10/2014  Admission Diagnoses: Syncope DM, II Hypertension Obesity Discharge Diagnoses:  Principle Problem: * Syncope, probably vasovagal* DM, II Hypertension Obesity Dehydration  Discharged Condition: fair  Hospital Course: 63 year old male with episode of syncope. Post severe cramp in left upper leg patient passed out. Per wife he was unconscious for about 5 minutes. No significant chest pain, palpitation or dizziness prior to passing out. No seizure activity noted and had vomiting x 2 on his way to ER. Heart rate in 40's in ER, improving to 50's post fluid resuscitation. No hypoglycemia. Echocardiogram showed good LV systolic function without significant valvular disease. His Lisinopril-HCTZ was discontinued and he will be followed by Dr. Shana ChuteSpruill in 1 week.  Consults: cardiology  Significant Diagnostic Studies: labs: normal CBC and BMET except elevated blood sugar and creatinine.   CXR-No acute cardiopulmonary process.  EKG-Sinus bradycardia. Non-specific T wave changes.  CT head without contrast: 1. No acute intracranial abnormality. 2. Mild atrophy with chronic microvascular ischemic disease.  Treatments: IV hydration  Discharge Exam: Blood pressure 127/47, pulse 51, temperature 97.7 F (36.5 C), temperature source Oral, resp. rate 18, height 5\' 5"  (1.651 m), weight 90.5 kg (199 lb 8.3 oz), SpO2 98 %. Physical Exam  Constitutional: He appears well-developed and well-nourished. No distress.  HENT: Normocephalic and atraumatic. Oropharynx is clear and moist. No oropharyngeal exudate. Eyes: Conjunctivae and EOM are normal. Pupils are equal, round, and reactive to light.  Neck: Normal range of motion. Neck supple.  Cardiovascular: Normal rate, regular rhythm, normal heart sounds and intact distal pulses.No murmur  heard. Pulmonary/Chest: Effort normal and breath sounds normal. No respiratory distress.  Abdominal: Soft. There is no tenderness. There is no rebound and no guarding.  Musculoskeletal: Normal range of motion. He exhibits no edema or tenderness.  Neurological: He is alert and oriented to person, place, and time. No cranial nerve deficit. He exhibits normal muscle tone. Coordination normal. No ataxia on finger to nose bilaterally. No pronator drift. 5/5 strength throughout. CN 2-12 intact. Negative Romberg. Equal grip strength. Sensation intact. Gait is normal.  Skin: Skin is warm.  Psychiatric: He has a normal mood and affect. His behavior is normal.    Disposition: 01-Home or Self Care     Medication List    STOP taking these medications        lisinopril-hydrochlorothiazide 20-12.5 MG per tablet  Commonly known as:  PRINZIDE,ZESTORETIC      TAKE these medications        aspirin 81 MG EC tablet  Take 1 tablet (81 mg total) by mouth daily.     atorvastatin 20 MG tablet  Commonly known as:  LIPITOR  Take 20 mg by mouth daily.     LANTUS SOLOSTAR 100 UNIT/ML Solostar Pen  Generic drug:  Insulin Glargine  Inject 30 Units into the skin at bedtime.     levothyroxine 100 MCG tablet  Commonly known as:  SYNTHROID, LEVOTHROID  Take 100 mcg by mouth daily before breakfast.     losartan 50 MG tablet  Commonly known as:  COZAAR  Take 50 mg by mouth daily.     nitroGLYCERIN 0.4 MG SL tablet  Commonly known as:  NITROSTAT  Place 1 tablet (0.4 mg total) under the tongue every 5 (five) minutes x 3 doses as needed for chest pain.     NOVOLOG FLEXPEN 100 UNIT/ML FlexPen  Generic drug:  insulin aspart  Inject 5-17 Units into the skin as directed. CBG 130-150:5units, 151-200:7units, 201-250:10units, 251-300:12units, 301-350:15units, 351-400:17units     oxyCODONE-acetaminophen 5-325 MG per tablet  Commonly known as:  PERCOCET  Take 1-2 tablets by mouth every 6 (six) hours as  needed for severe pain.           Follow-up Information    Follow up with Pola CornSPRUILL,JEROME O, MD. Schedule an appointment as soon as possible for a visit in 1 week.   Specialty:  Cardiology   Contact information:   [address] [place] [address] [phone]       Signed: Ricki RodriguezKADAKIA,Peniel Biel S 09/10/2014, 6:06 PM

## 2014-09-10 NOTE — Plan of Care (Signed)
Problem: Consults Goal: Nutrition Consult-if indicated Outcome: Completed/Met Date Met:  09/10/14     

## 2014-09-10 NOTE — Progress Notes (Signed)
  Echocardiogram 2D Echocardiogram has been performed.  [name] 09/10/2014, 11:27 AM

## 2014-09-10 NOTE — ED Notes (Signed)
EMS adm 4 mg of zofran as pt actively vomited two times en route to department.

## 2014-09-10 NOTE — ED Notes (Signed)
Spoke to flow manager about status of patient bed, reports that we are holding telemetry beds at this time.

## 2014-09-10 NOTE — ED Notes (Signed)
Pt back from CT and x ray

## 2014-09-10 NOTE — Plan of Care (Signed)
Problem: Consults Goal: General Medical Patient Education See Patient Education Module for specific education.  Outcome: Completed/Met Date Met:  09/10/14

## 2014-09-10 NOTE — Plan of Care (Signed)
Problem: Consults Goal: Skin Care Protocol Initiated - if Braden Score 18 or less If consults are not indicated, leave blank or document N/A  Outcome: Not Applicable Date Met:  41/36/43

## 2014-09-10 NOTE — H&P (Signed)
Referring Physician:  Lundy Cozart is an 63 y.o. male.                       Chief Complaint: Syncope  HPI: 63 year old male with episode of syncope. Post severe cramp in left upper leg patient passed out. Per wife he was unconscious for about 5 minutes. No significant chest pain, palpitation or dizziness prior to passing out. No seizure activity noted and had vomiting x 2 on his way to ER. Heart rate in 40's in ER, improving to 50's post fluid resuscitation. No hypoglycemia.  Past Medical History  Diagnosis Date  . DM (diabetes mellitus)   . HTN (hypertension)   . Hyperlipemia   . Heart attack       Past Surgical History  Procedure Laterality Date  . Colon surgery    . Diverting colostomy and i&d fourniers  04/10/02    Dr. Judeth Horn  . Skin graft  05/08/02    Dr. Harlow Mares  . Reversal of colostomy  07/27/02     Dr. Judeth Horn  . Cholecystectomy N/A 07/02/2013    Procedure: LAPAROSCOPIC CHOLECYSTECTOMY WITH INTRAOPERATIVE CHOLANGIOGRAM ;  Surgeon: Gayland Curry, MD;  Location: [facility] OR;  Service: General;  Laterality: N/A;    Family History  Problem Relation Age of Onset  . COPD Mother   . Lung cancer Father   . Diabetes Paternal Uncle   . Hearing loss Paternal Uncle    Social History:  reports that he quit smoking about 10 years ago. His smoking use included Cigarettes. He smoked 3.00 packs per day. He has never used smokeless tobacco. He reports that he does not drink alcohol or use illicit drugs.  Allergies: No Known Allergies   (Not in a hospital admission)  Results for orders placed or performed during the hospital encounter of 09/10/14 (from the past 48 hour(s))  Comprehensive metabolic panel     Status: Abnormal   Collection Time: 09/10/14  4:57 AM  Result Value Ref Range   Sodium 141 137 - 147 mEq/L   Potassium 3.8 3.7 - 5.3 mEq/L   Chloride 98 96 - 112 mEq/L   CO2 27 19 - 32 mEq/L   Glucose, Bld 206 (H) 70 - 99 mg/dL   BUN 18 6 - 23 mg/dL   Creatinine, Ser  1.51 (H) 0.50 - 1.35 mg/dL   Calcium 9.7 8.4 - 10.5 mg/dL   Total Protein 7.3 6.0 - 8.3 g/dL   Albumin 4.0 3.5 - 5.2 g/dL   AST 20 0 - 37 U/L   ALT 21 0 - 53 U/L   Alkaline Phosphatase 62 39 - 117 U/L   Total Bilirubin 0.2 (L) 0.3 - 1.2 mg/dL   GFR calc non Af Amer 48 (L) >90 mL/min   GFR calc Af Amer 56 (L) >90 mL/min    Comment: (NOTE) The eGFR has been calculated using the CKD EPI equation. This calculation has not been validated in all clinical situations. eGFR's persistently <90 mL/min signify possible Chronic Kidney Disease.    Anion gap 16 (H) 5 - 15  CBC     Status: Abnormal   Collection Time: 09/10/14  4:57 AM  Result Value Ref Range   WBC 6.7 4.0 - 10.5 K/uL   RBC 4.86 4.22 - 5.81 MIL/uL   Hemoglobin 13.0 13.0 - 17.0 g/dL   HCT 38.3 (L) 39.0 - 52.0 %   MCV 78.8 78.0 - 100.0 fL  MCH 26.7 26.0 - 34.0 pg   MCHC 33.9 30.0 - 36.0 g/dL   RDW 12.8 11.5 - 15.5 %   Platelets 196 150 - 400 K/uL  CK     Status: None   Collection Time: 09/10/14  4:57 AM  Result Value Ref Range   Total CK 221 7 - 232 U/L   Dg Chest 2 View  09/10/2014   CLINICAL DATA:  Acute onset of headache. Left leg cramp, and chest tightness and dizziness. Current history of diabetes and personal history of smoking. Initial encounter.  EXAM: CHEST  2 VIEW  COMPARISON:  Chest radiograph performed 06/30/2013  FINDINGS: The lungs are well-aerated and clear. There is no evidence of focal opacification, pleural effusion or pneumothorax.  The cardiomediastinal silhouette is within normal limits. No acute osseous abnormalities are seen.  IMPRESSION: No acute cardiopulmonary process seen.   Electronically Signed   By: Garald Balding M.D.   On: 09/10/2014 06:29   Ct Head Wo Contrast  09/10/2014   CLINICAL DATA:  Initial evaluation for headache, garbled speech.  EXAM: CT HEAD WITHOUT CONTRAST  TECHNIQUE: Contiguous axial images were obtained from the base of the skull through the vertex without intravenous contrast.   COMPARISON:  Prior CT from 02/22/2008.  FINDINGS: Mild age-related atrophy with chronic microvascular ischemic disease present.  There is no acute intracranial hemorrhage or infarct. No mass lesion or midline shift. Gray-white matter differentiation is well maintained. Ventricles are normal in size without evidence of hydrocephalus. CSF containing spaces are within normal limits. No extra-axial fluid collection.  The calvarium is intact.  Orbital soft tissues are within normal limits.  The paranasal sinuses and mastoid air cells are well pneumatized and free of fluid.  Scalp soft tissues are unremarkable.  IMPRESSION: 1. No acute intracranial abnormality. 2. Mild atrophy with chronic microvascular ischemic disease.   Electronically Signed   By: Jeannine Boga M.D.   On: 09/10/2014 06:22    Review Of Systems Constitutional: Negative for fever, activity change and appetite change.  Respiratory: Negative for cough, chest tightness and shortness of breath.  Cardiovascular: Positive for syncope. Negative for chest pain.  Gastrointestinal: Negative for nausea, vomiting and abdominal pain.  Genitourinary: Negative for dysuria and hematuria.  Musculoskeletal: Positive for myalgias and arthralgias.  Skin: Negative for rash.  Neurological: Positive for syncope and headaches. Negative for dizziness and light-headedness.   Blood pressure 112/46, pulse 49, temperature 97.3 F (36.3 C), temperature source Oral, resp. rate 10, height $RemoveBe'5\' 5"'[id number]$  (1.651 m), weight 92.987 kg (205 lb), SpO2 100 %.  Physical Exam  Constitutional: He appears well-developed and well-nourished. No distress.  HENT: Normocephalic and atraumatic. Oropharynx is clear and moist. No oropharyngeal exudate. Eyes: Conjunctivae and EOM are normal. Pupils are equal, round, and reactive to light.  Neck: Normal range of motion. Neck supple.   Cardiovascular: Normal rate, regular rhythm, normal heart sounds and intact distal pulses.  No murmur  heard. Pulmonary/Chest: Effort normal and breath sounds normal. No respiratory distress.  Abdominal: Soft. There is no tenderness. There is no rebound and no guarding.  Musculoskeletal: Normal range of motion. He exhibits no edema or tenderness.  Neurological: He is alert and oriented to person, place, and time. No cranial nerve deficit. He exhibits normal muscle tone. Coordination normal. No ataxia on finger to nose bilaterally. No pronator drift. 5/5 strength throughout. CN 2-12 intact. Negative Romberg. Equal grip strength. Sensation intact. Gait is normal.  Skin: Skin is warm.  Psychiatric: He has  a normal mood and affect. His behavior is normal.  Nursing note and vitals reviewed.  Assessment/Plan Syncope DM, II Hypertension Obesity  Place in observation 2-D echo and monitoring. Hold home medications.  Birdie Riddle, MD  09/10/2014, 7:53 AM

## 2014-09-10 NOTE — ED Notes (Signed)
Per EMS pt woke his wife up after he woke up from his sleep with a significant leg cramp, pt's wife reports the pt then went unconscious for about 4-5 minutes, EMS arrived pt was very diaphoretic and speech was garbled. Pt is A&O X4 upon initial assessment. Denies pain, SOB.

## 2014-09-10 NOTE — Plan of Care (Signed)
Problem: Consults Goal: Diabetes Guidelines if Diabetic/Glucose > 140 If diabetic or lab glucose is > 140 mg/dl - Initiate Diabetes/Hyperglycemia Guidelines & Document Interventions  Outcome: Completed/Met Date Met:  09/10/14

## 2016-11-19 ENCOUNTER — Other Ambulatory Visit: Payer: Medicare HMO

## 2016-11-19 ENCOUNTER — Other Ambulatory Visit: Payer: Self-pay | Admitting: Cardiology

## 2016-11-19 ENCOUNTER — Ambulatory Visit
Admission: RE | Admit: 2016-11-19 | Discharge: 2016-11-19 | Disposition: A | Discharge status: Home / Self Care | Payer: Medicare HMO | Source: Ambulatory Visit | Attending: Cardiology | Admitting: Cardiology

## 2016-11-19 DIAGNOSIS — M549 Dorsalgia, unspecified: Secondary | ICD-10-CM

## 2016-11-19 DIAGNOSIS — M25512 Pain in left shoulder: Secondary | ICD-10-CM

## 2016-11-19 DIAGNOSIS — M25511 Pain in right shoulder: Secondary | ICD-10-CM

## 2017-02-08 DIAGNOSIS — Z125 Encounter for screening for malignant neoplasm of prostate: Secondary | ICD-10-CM | POA: Diagnosis not present

## 2017-02-08 DIAGNOSIS — Z6834 Body mass index (BMI) 34.0-34.9, adult: Secondary | ICD-10-CM | POA: Diagnosis not present

## 2017-02-08 DIAGNOSIS — E034 Atrophy of thyroid (acquired): Secondary | ICD-10-CM | POA: Diagnosis not present

## 2017-02-08 DIAGNOSIS — E039 Hypothyroidism, unspecified: Secondary | ICD-10-CM | POA: Diagnosis not present

## 2017-02-08 DIAGNOSIS — E6609 Other obesity due to excess calories: Secondary | ICD-10-CM | POA: Diagnosis not present

## 2017-02-08 DIAGNOSIS — I10 Essential (primary) hypertension: Secondary | ICD-10-CM | POA: Diagnosis not present

## 2017-02-08 DIAGNOSIS — E782 Mixed hyperlipidemia: Secondary | ICD-10-CM | POA: Diagnosis not present

## 2017-02-08 DIAGNOSIS — E108 Type 1 diabetes mellitus with unspecified complications: Secondary | ICD-10-CM | POA: Diagnosis not present

## 2017-02-13 DIAGNOSIS — L0231 Cutaneous abscess of buttock: Secondary | ICD-10-CM | POA: Diagnosis not present

## 2017-02-13 DIAGNOSIS — E039 Hypothyroidism, unspecified: Secondary | ICD-10-CM | POA: Diagnosis not present

## 2017-02-13 DIAGNOSIS — I10 Essential (primary) hypertension: Secondary | ICD-10-CM | POA: Diagnosis not present

## 2017-02-13 DIAGNOSIS — E669 Obesity, unspecified: Secondary | ICD-10-CM | POA: Diagnosis not present

## 2017-02-13 DIAGNOSIS — E782 Mixed hyperlipidemia: Secondary | ICD-10-CM | POA: Diagnosis not present

## 2017-02-13 DIAGNOSIS — E108 Type 1 diabetes mellitus with unspecified complications: Secondary | ICD-10-CM | POA: Diagnosis not present

## 2017-02-14 DIAGNOSIS — I10 Essential (primary) hypertension: Secondary | ICD-10-CM | POA: Diagnosis not present

## 2017-02-14 DIAGNOSIS — Z833 Family history of diabetes mellitus: Secondary | ICD-10-CM | POA: Diagnosis not present

## 2017-02-14 DIAGNOSIS — M545 Low back pain: Secondary | ICD-10-CM | POA: Diagnosis not present

## 2017-02-14 DIAGNOSIS — E1122 Type 2 diabetes mellitus with diabetic chronic kidney disease: Secondary | ICD-10-CM | POA: Diagnosis not present

## 2017-02-14 DIAGNOSIS — E1151 Type 2 diabetes mellitus with diabetic peripheral angiopathy without gangrene: Secondary | ICD-10-CM | POA: Diagnosis not present

## 2017-02-14 DIAGNOSIS — Z87891 Personal history of nicotine dependence: Secondary | ICD-10-CM | POA: Diagnosis not present

## 2017-02-14 DIAGNOSIS — N183 Chronic kidney disease, stage 3 (moderate): Secondary | ICD-10-CM | POA: Diagnosis not present

## 2017-02-14 DIAGNOSIS — E785 Hyperlipidemia, unspecified: Secondary | ICD-10-CM | POA: Diagnosis not present

## 2017-02-14 DIAGNOSIS — E669 Obesity, unspecified: Secondary | ICD-10-CM | POA: Diagnosis not present

## 2017-02-14 DIAGNOSIS — E1165 Type 2 diabetes mellitus with hyperglycemia: Secondary | ICD-10-CM | POA: Diagnosis not present

## 2017-02-14 DIAGNOSIS — Z809 Family history of malignant neoplasm, unspecified: Secondary | ICD-10-CM | POA: Diagnosis not present

## 2017-02-14 DIAGNOSIS — E039 Hypothyroidism, unspecified: Secondary | ICD-10-CM | POA: Diagnosis not present

## 2017-03-01 ENCOUNTER — Ambulatory Visit (HOSPITAL_COMMUNITY)
Admission: EM | Admit: 2017-03-01 | Discharge: 2017-03-01 | Disposition: A | Discharge status: Home / Self Care | Payer: Medicare HMO | Attending: Internal Medicine | Admitting: Internal Medicine

## 2017-03-01 ENCOUNTER — Encounter (HOSPITAL_COMMUNITY): Payer: Self-pay | Admitting: *Deleted

## 2017-03-01 DIAGNOSIS — M545 Low back pain, unspecified: Secondary | ICD-10-CM

## 2017-03-01 LAB — POCT I-STAT, CHEM 8
BUN: 28 mg/dL — AB (ref 6–20)
CREATININE: 1.7 mg/dL — AB (ref 0.61–1.24)
Calcium, Ion: 1.19 mmol/L (ref 1.15–1.40)
Chloride: 101 mmol/L (ref 101–111)
Glucose, Bld: 267 mg/dL — ABNORMAL HIGH (ref 65–99)
HCT: 41 % (ref 39.0–52.0)
Hemoglobin: 13.9 g/dL (ref 13.0–17.0)
Potassium: 3.9 mmol/L (ref 3.5–5.1)
SODIUM: 139 mmol/L (ref 135–145)
TCO2: 27 mmol/L (ref 0–100)

## 2017-03-01 MED ORDER — METHOCARBAMOL 500 MG PO TABS: 500.0000 mg | ORAL_TABLET | Freq: Two times a day (BID) | ORAL | 0 refills | Status: DC

## 2017-03-01 NOTE — ED Provider Notes (Signed)
CSN: [id number]     Arrival date & time 03/01/17  1600 History   First MD Initiated Contact with Patient 03/01/17 1620     Chief Complaint  Patient presents with  . Back Pain   (Consider location/radiation/quality/duration/timing/severity/associated sxs/prior Treatment) 66 year old male presents to clinic for evaluation of back pain, some ongoing for 2 weeks. Was seen by his primary care provider 2 weeks ago, diagnosed with stage IV renal disease, patient's wife is concerned about his kidney function, and is concerned this back pain may be related. He has taken over-the-counter ibuprofen, and this has helped some, however his wife his stated she is trying to stop him from taking this medicine, due to his kidneys.   The history is provided by the patient.  Back Pain  Location:  Generalized Quality:  Cramping and aching Radiates to:  Does not radiate Pain severity:  Moderate Pain is:  Same all the time Onset quality:  Gradual Duration:  2 weeks Timing:  Constant Progression:  Unchanged Chronicity:  New Context: not falling, not lifting heavy objects, not MCA, not MVA, not physical stress, not recent injury and not twisting   Relieved by:  Nothing Worsened by:  Twisting, movement and lying down Ineffective treatments:  Being still, heating pad, ibuprofen and OTC medications Associated symptoms: no abdominal pain, no abdominal swelling, no bladder incontinence, no bowel incontinence, no dysuria, no headaches, no numbness, no paresthesias, no tingling and no weight loss     Past Medical History:  Diagnosis Date  . DM (diabetes mellitus) (HCC)   . Heart attack (HCC)   . HTN (hypertension)   . Hyperlipemia    Past Surgical History:  Procedure Laterality Date  . CHOLECYSTECTOMY N/A 07/02/2013   Procedure: LAPAROSCOPIC CHOLECYSTECTOMY WITH INTRAOPERATIVE CHOLANGIOGRAM ;  Surgeon: Atilano InaEric M Wilson, MD;  [facility];  Service: General;  Laterality: N/A;  . COLON SURGERY    . diverting  colostomy and I&D fourniers  04/10/02   Dr. Jimmye NormanJames Wyatt  . reversal of colostomy  07/27/02    Dr. Jimmye NormanJames Wyatt  . SKIN GRAFT  05/08/02   Dr. Odis LusterBowers   Family History  Problem Relation Age of Onset  . COPD Mother   . Lung cancer Father   . Diabetes Paternal Uncle   . Hearing loss Paternal Uncle    Social History  Substance Use Topics  . Smoking status: Former Smoker    Packs/day: 3.00    Types: Cigarettes    Quit date: 03/08/2004  . Smokeless tobacco: Never Used  . Alcohol use No    Review of Systems  Constitutional: Negative.  Negative for weight loss.  HENT: Negative.   Respiratory: Negative.   Cardiovascular: Negative.   Gastrointestinal: Negative for abdominal pain, bowel incontinence, diarrhea, nausea and vomiting.  Genitourinary: Negative for bladder incontinence and dysuria.  Musculoskeletal: Positive for back pain.  Skin: Negative.   Neurological: Negative for tingling, numbness, headaches and paresthesias.    Allergies  Patient has no known allergies.  Home Medications   Prior to Admission medications   Medication Sig Start Date End Date Taking? Authorizing Provider  aspirin EC 81 MG EC tablet Take 1 tablet (81 mg total) by mouth daily. 07/04/13   Rinaldo CloudHarwani, Mohan, MD  atorvastatin (LIPITOR) 20 MG tablet Take 20 mg by mouth daily.    [provider]  insulin aspart (NOVOLOG FLEXPEN) 100 UNIT/ML SOPN FlexPen Inject 5-17 Units into the skin as directed. CBG 130-150:5units, 151-200:7units, 201-250:10units, 251-300:12units, 301-350:15units, 351-400:17units  [provider]  Insulin Glargine (LANTUS SOLOSTAR) 100 UNIT/ML SOPN Inject 30 Units into the skin at bedtime.    [provider]  levothyroxine (SYNTHROID, LEVOTHROID) 100 MCG tablet Take 100 mcg by mouth daily before breakfast.    [provider]  losartan (COZAAR) 50 MG tablet Take 50 mg by mouth daily.    [provider]  methocarbamol (ROBAXIN) 500 MG tablet Take 1  tablet (500 mg total) by mouth 2 (two) times daily. 03/01/17   Dorena Bodo, NP  nitroGLYCERIN (NITROSTAT) 0.4 MG SL tablet Place 1 tablet (0.4 mg total) under the tongue every 5 (five) minutes x 3 doses as needed for chest pain. 07/04/13   Rinaldo Cloud, MD  oxyCODONE-acetaminophen (PERCOCET) 5-325 MG per tablet Take 1-2 tablets by mouth every 6 (six) hours as needed for severe pain. Patient not taking: Reported on 09/10/2014 04/28/14   Street, [place], PA-C   Meds Ordered and Administered this Visit  Medications - No data to display  BP 130/72 (BP Location: Right Arm)   Pulse 82   Temp 98.6 F (37 C) (Oral)   Resp 18   SpO2 100%  No data found.   Physical Exam  Constitutional: He is oriented to person, place, and time. He appears well-developed and well-nourished. No distress.  HENT:  Head: Normocephalic.  Right Ear: External ear normal.  Left Ear: External ear normal.  Eyes: Conjunctivae are normal.  Neck: Normal range of motion.  Cardiovascular: Normal rate and regular rhythm.   Pulmonary/Chest: Effort normal and breath sounds normal.  Musculoskeletal:       Lumbar back: He exhibits tenderness, pain and spasm. He exhibits normal range of motion, no bony tenderness and no swelling.       Back:  Neurological: He is alert and oriented to person, place, and time.  Skin: Skin is warm and dry. Capillary refill takes less than 2 seconds. He is not diaphoretic.  Psychiatric: He has a normal mood and affect. His behavior is normal.  Nursing note and vitals reviewed.   Urgent Care Course     Procedures (including critical care time)  Labs Review Labs Reviewed  POCT I-STAT, CHEM 8 - Abnormal; Notable for the following:       Result Value   BUN 28 (*)    Creatinine, Ser 1.70 (*)    Glucose, Bld 267 (*)    All other components within normal limits    Imaging Review No results found.    MDM   1. Acute right-sided low back pain without sciatica    Advised against  using any OTC NSAIDs due to renal function, given prescription for Robaxin, recommend following up with his primary care provider for possible referral to nephrology.      Dorena Bodo, NP 03/01/17 [phone]

## 2017-03-01 NOTE — ED Triage Notes (Signed)
Pt  Reports  r    Flank  Pain  For     sev  Weeks  Denies   specefic  Injury  But  His  Doctor  Says   He  Might  Have  Kidney  Failure

## 2017-03-01 NOTE — ED Notes (Signed)
Patient is unable to void at this time 

## 2017-03-01 NOTE — Discharge Instructions (Signed)
Due to your chronic kidney disease, do not take any ibuprofen, Motrin, Aleve, Naprosyn, or any other over-the-counter NSAID. You may take over-the-counter Tylenol as needed for pain. I have also prescribed a medicine called Robaxin, take one tablet twice a day as needed. Recommend following up with your primary care provider for referral to nephrology.

## 2017-03-02 ENCOUNTER — Emergency Department (HOSPITAL_COMMUNITY)
Admission: EM | Admit: 2017-03-02 | Discharge: 2017-03-03 | Disposition: A | Discharge status: Home / Self Care | Payer: Medicare HMO | Attending: Emergency Medicine | Admitting: Emergency Medicine

## 2017-03-02 ENCOUNTER — Encounter (HOSPITAL_COMMUNITY): Payer: Self-pay

## 2017-03-02 DIAGNOSIS — M545 Low back pain: Secondary | ICD-10-CM | POA: Diagnosis present

## 2017-03-02 DIAGNOSIS — Z87891 Personal history of nicotine dependence: Secondary | ICD-10-CM | POA: Insufficient documentation

## 2017-03-02 DIAGNOSIS — E119 Type 2 diabetes mellitus without complications: Secondary | ICD-10-CM | POA: Insufficient documentation

## 2017-03-02 DIAGNOSIS — I10 Essential (primary) hypertension: Secondary | ICD-10-CM | POA: Insufficient documentation

## 2017-03-02 DIAGNOSIS — R109 Unspecified abdominal pain: Secondary | ICD-10-CM | POA: Diagnosis not present

## 2017-03-02 DIAGNOSIS — M5441 Lumbago with sciatica, right side: Secondary | ICD-10-CM | POA: Diagnosis not present

## 2017-03-02 DIAGNOSIS — I252 Old myocardial infarction: Secondary | ICD-10-CM | POA: Diagnosis not present

## 2017-03-02 DIAGNOSIS — Z79899 Other long term (current) drug therapy: Secondary | ICD-10-CM | POA: Diagnosis not present

## 2017-03-02 DIAGNOSIS — Z794 Long term (current) use of insulin: Secondary | ICD-10-CM | POA: Diagnosis not present

## 2017-03-02 LAB — URINALYSIS, ROUTINE W REFLEX MICROSCOPIC
BACTERIA UA: NONE SEEN
Bilirubin Urine: NEGATIVE
Glucose, UA: >=500 mg/dL — AB
Ketones, ur: NEGATIVE mg/dL
Leukocytes, UA: NEGATIVE
Nitrite: NEGATIVE
Protein, ur: NEGATIVE mg/dL
RBC / HPF: NONE SEEN RBC/hpf (ref 0–5)
SQUAMOUS EPITHELIAL / LPF: NONE SEEN
Specific Gravity, Urine: 1.015 (ref 1.005–1.030)
pH: 5 (ref 5.0–8.0)

## 2017-03-02 MED ORDER — HYDROMORPHONE HCL 1 MG/ML IJ SOLN
1.0000 mg | Freq: Once | INTRAMUSCULAR | Status: AC
  Administered 2017-03-02: 1 mg via INTRAVENOUS
  Filled 2017-03-02: qty 1

## 2017-03-02 NOTE — ED Provider Notes (Signed)
[facility]-EMERGENCY DEPT Provider Note   CSN: [id number] Arrival date & time: 03/02/17  1919  By signing my name below, I, Teofilo Pod, attest that this documentation has been prepared under the direction and in the presence of Graciella Freer, [place]. Electronically Signed: Teofilo Pod, ED Scribe. 03/02/2017. 10:33 PM.   History   Chief Complaint Chief Complaint  Patient presents with  . Back Pain    The history is provided by the patient. No language interpreter was used.   HPI Comments:  [name] is a 66 y.o. male with PMHx of CKD, DM and HTN who presents to the Emergency Department complaining of ongoing right flank pain/right back x 3 weeks. He states that the pain is made worse with changing positions and radiates down to the posterior aspect of his right lower extremity. He reports that he has been able to ambulate but reports worsening pain. He reports that pain has worsened over the last week. Wife brought him into the emergency department because she is concerned that his chronic kidney disease is causing his back pain. He was seen at urgent care on 03/01/17 for evaluation of same symptoms. He was discharged home with a short course of Robaxin which she has been taking but states he has had minimal relief. He has not been taking any other medications. Patient reports that over the last week or so he has had difficulty urinating while in a standing position secondary to pain. He reports that once he changed position he has no difficulties. He denies any urinary retention,, dysuria, hematuria, chest pain, shortness of breath, fever. He denies any new traumas or falls. Denies fevers, weight loss, numbness/weakness of upper and lower extremities, bowel/bladder incontinence, saddle anesthesia, history of back surgery, history of IVDA.    Past Medical History:  Diagnosis Date  . DM (diabetes mellitus) (HCC)   . Heart attack (HCC)   . HTN (hypertension)   . Hyperlipemia      Patient Active Problem List   Diagnosis Date Noted  . Syncope 09/10/2014  . Gallstone pancreatitis 07/01/2013  . Cholelithiasis 07/01/2013    Past Surgical History:  Procedure Laterality Date  . CHOLECYSTECTOMY N/A 07/02/2013   Procedure: LAPAROSCOPIC CHOLECYSTECTOMY WITH INTRAOPERATIVE CHOLANGIOGRAM ;  Surgeon: Atilano Ina, MD;  Location: [facility] OR;  Service: General;  Laterality: N/A;  . COLON SURGERY    . diverting colostomy and I&D fourniers  04/10/02   Dr. Jimmye Norman  . reversal of colostomy  07/27/02    Dr. Jimmye Norman  . SKIN GRAFT  05/08/02   Dr. Odis Luster       Home Medications    Prior to Admission medications   Medication Sig Start Date End Date Taking? Authorizing Provider  aspirin EC 81 MG EC tablet Take 1 tablet (81 mg total) by mouth daily. 07/04/13   Rinaldo Cloud, MD  atorvastatin (LIPITOR) 20 MG tablet Take 20 mg by mouth daily.    [provider]  insulin aspart (NOVOLOG FLEXPEN) 100 UNIT/ML SOPN FlexPen Inject 5-17 Units into the skin as directed. CBG 130-150:5units, 151-200:7units, 201-250:10units, 251-300:12units, 301-350:15units, 351-400:17units    [provider]  Insulin Glargine (LANTUS SOLOSTAR) 100 UNIT/ML SOPN Inject 30 Units into the skin at bedtime.    [provider]  levothyroxine (SYNTHROID, LEVOTHROID) 100 MCG tablet Take 100 mcg by mouth daily before breakfast.    [provider]  losartan (COZAAR) 50 MG tablet Take 50 mg by mouth daily.    [provider]  methocarbamol (ROBAXIN) 500 MG tablet Take 1 tablet (500 mg total) by mouth 2 (two) times daily. 03/01/17   Dorena Bodo, NP  nitroGLYCERIN (NITROSTAT) 0.4 MG SL tablet Place 1 tablet (0.4 mg total) under the tongue every 5 (five) minutes x 3 doses as needed for chest pain. 07/04/13   Rinaldo Cloud, MD  oxyCODONE-acetaminophen (PERCOCET/ROXICET) 5-325 MG tablet Take 2 tablets by mouth every 4 (four) hours as needed for severe pain. 03/03/17    Maxwell Caul, PA-C    Family History Family History  Problem Relation Age of Onset  . COPD Mother   . Lung cancer Father   . Diabetes Paternal Uncle   . Hearing loss Paternal Uncle     Social History Social History  Substance Use Topics  . Smoking status: Former Smoker    Packs/day: 3.00    Types: Cigarettes    Quit date: 03/08/2004  . Smokeless tobacco: Never Used  . Alcohol use No     Allergies   Patient has no known allergies.   Review of Systems Review of Systems  Constitutional: Negative for fever.  Respiratory: Negative for cough and shortness of breath.   Gastrointestinal: Negative for nausea and vomiting.  Genitourinary: Positive for flank pain. Negative for hematuria.  Musculoskeletal: Positive for back pain. Negative for neck pain.  Neurological: Negative for weakness and numbness.  All other systems reviewed and are negative.    Physical Exam Updated Vital Signs BP 120/73 (BP Location: Right Arm)   Pulse 83   Temp 98.9 F (37.2 C) (Oral)   Resp 16   SpO2 99%   Physical Exam  Constitutional: He is oriented to person, place, and time. He appears well-developed and well-nourished.  Appears slightly uncomfortable on bed but no acute distress.  HENT:  Head: Normocephalic and atraumatic.  Eyes: Conjunctivae and EOM are normal. Right eye exhibits no discharge. Left eye exhibits no discharge. No scleral icterus.  Neck: Full passive range of motion without pain. No spinous process tenderness and no muscular tenderness present.  Flexion/extension and lateral movement of neck intact fully without any difficulty.  Cardiovascular: Normal rate and regular rhythm.   Pulmonary/Chest: Effort normal and breath sounds normal.  Abdominal: Soft. Normal appearance. There is no tenderness. There is CVA tenderness.  Minimal right-sided CVA tenderness.  Musculoskeletal: He exhibits no deformity.       Thoracic back: He exhibits no tenderness.       Lumbar back: He  exhibits no tenderness.  Diffuse tenderness palpation to the right paraspinal muscles of the lumbar region that extends into the posterior right gluteal. No tenderness palpation to the hip joint of the right side. No midline bony tenderness. Full flexion/extension of bilateral knees intact without difficulty. Good flexion/extension at the bilateral hips.  Neurological: He is alert and oriented to person, place, and time. GCS eye subscore is 4. GCS verbal subscore is 5. GCS motor subscore is 6.  Cranial nerves III-XII intact Follows commands, Moves all extremities  5/5 strength to BUE and BLE  Sensation intact throughout  Normal finger to nose. No dysdiadochokinesia. No pronator drift. No slurred speech. No facial droop.  Negative straight leg raise test.  Skin: Skin is warm and dry.  Psychiatric: He has a normal mood and affect. His speech is normal and behavior is normal.  Nursing note and vitals reviewed.    ED Treatments / Results  DIAGNOSTIC STUDIES:  Oxygen Saturation is 100% on RA, normal by my interpretation.  COORDINATION OF CARE:  10:12 PM Discussed treatment plan with pt at bedside and pt agreed to plan.   Labs (all labs ordered are listed, but only abnormal results are displayed) Labs Reviewed  URINALYSIS, ROUTINE W REFLEX MICROSCOPIC - Abnormal; Notable for the following:       Result Value   Color, Urine STRAW (*)    Glucose, UA >=500 (*)    Hgb urine dipstick SMALL (*)    All other components within normal limits    EKG  EKG Interpretation None       Radiology Ct Abdomen Pelvis Wo Contrast  Result Date: 03/03/2017 CLINICAL DATA:  Subacute onset of right flank pain and lower back pain. Initial encounter. EXAM: CT ABDOMEN AND PELVIS WITHOUT CONTRAST TECHNIQUE: Multidetector CT imaging of the abdomen and pelvis was performed following the standard protocol without IV contrast. COMPARISON:  Abdominal ultrasound performed 06/30/2013, and lumbar spine  radiographs performed 11/19/2016 FINDINGS: Lower chest: The visualized lung bases are grossly clear. The visualized portions of the mediastinum are unremarkable. Hepatobiliary: The liver is unremarkable in appearance. The patient is status post cholecystectomy, with clips noted at the gallbladder fossa. The common bile duct remains normal in caliber. Pancreas: The pancreas is within normal limits. Spleen: The spleen is unremarkable in appearance. Adrenals/Urinary Tract: The adrenal glands are unremarkable in appearance. Nonspecific perinephric stranding is noted bilaterally. There is no evidence of hydronephrosis. No renal or ureteral stones are identified. Stomach/Bowel: The stomach is unremarkable in appearance. The small bowel is within normal limits. The appendix is normal in caliber, without evidence of appendicitis. The colon is unremarkable in appearance. Vascular/Lymphatic: Scattered calcification is seen along the abdominal aorta and its branches. The abdominal aorta is otherwise grossly unremarkable. The inferior vena cava is grossly unremarkable. No retroperitoneal lymphadenopathy is seen. No pelvic sidewall lymphadenopathy is identified. Reproductive: The bladder is significantly distended. The prostate remains normal in size. Other: No additional soft tissue abnormalities are seen. Musculoskeletal: No acute osseous abnormalities are identified. The visualized musculature is unremarkable in appearance. IMPRESSION: 1. No acute abnormality seen in the abdomen or pelvis. 2. Scattered aortic atherosclerosis. Electronically Signed   By: Roanna RaiderJeffery  Chang M.D.   On: 03/03/2017 00:53    Procedures Procedures (including critical care time)  Medications Ordered in ED Medications  HYDROmorphone (DILAUDID) injection 1 mg (1 mg Intravenous Given 03/02/17 2311)     Initial Impression / Assessment and Plan / ED Course  I have reviewed the triage vital signs and the nursing notes.  Pertinent labs & imaging  results that were available during my care of the patient were reviewed by me and considered in my medical decision making (see chart for details).     66 year old male who presents with 3 weeks of right sided back pain. Wife brought patient in because he has not had any pain relief from the Robaxin that he was prescribed previously. Wife states that she wants the patient's kidneys evaluated to know if the pain is being caused by her husband's chronic kidney failure. She would also like him admitted for full evaluation. Discussed that we will attempt to establish pain control in the department and evaluates with a few tests before making a decision whether or not patient needs to be admitted. Given history/physical exam, concern for back pain with sciatica. Also consider kidney stone. Patient has no red flag symptoms and no neuro deficits on exam. History/physical exam are not concerning for cauda equina. Given CVA tenderness will obtain UA to  evaluate urine. Patient given a dose pain medications in the department.  UA reviewed. Small amount of hemoglobin noted. Given patient's chronic kidney disease, will plan to obtain a non-con CT abdomen/pelvis to evaluate for any presence of kidney stone or kidney infection. Discussed plan with patient and family.  CT reviewed. No evidence of kidney stones or hydronephrosis. No osseous abnormalities seen.  Discussed with patient. He reports improvement in pain after pain medication given in the department. He states that his pain is now a 2/10. He was able to ambulate in the room without any difficulty and minimal pain. Given history/physical exam and findings, patient is stable for discharge at this time. Will plan to send patient home with a very short course of pain medication for adequate pain control. Instructed him to follow up with his primary care doctor in the next 24-48 hours to evaluate his options for pain management. Strict return precautions discussed.  Patient was understanding and agreement to plan.  Final Clinical Impressions(s) / ED Diagnoses   Final diagnoses:  Acute right-sided low back pain with right-sided sciatica    New Prescriptions Discharge Medication List as of 03/03/2017  1:36 AM    I personally performed the services described in this documentation, which was scribed in my presence. The recorded information has been reviewed and is accurate.    Maxwell Caul, PA-C 03/04/17 0151    Linwood Dibbles, MD 03/04/17 [phone]

## 2017-03-02 NOTE — ED Triage Notes (Signed)
[facility], has had back pain for the past two week, that goes to R flank area, worse with movement, supposed to follow up with primary to get appt with urologist.

## 2017-03-03 ENCOUNTER — Emergency Department (HOSPITAL_COMMUNITY): Payer: Medicare HMO

## 2017-03-03 DIAGNOSIS — R109 Unspecified abdominal pain: Secondary | ICD-10-CM | POA: Diagnosis not present

## 2017-03-03 MED ORDER — OXYCODONE-ACETAMINOPHEN 5-325 MG PO TABS: 2.0000 | ORAL_TABLET | ORAL | 0 refills | Status: DC | PRN

## 2017-03-03 NOTE — Discharge Instructions (Signed)
Follow-up with her primary care doctor next 24-48 hours for further evaluation  Take pain medication as directed.  Return to the Emergency Department for: Fever  Leg weakness Urinary or bowel accidents  Abdominal pain Worsening or severe pain  Difficulty walking

## 2017-03-06 DIAGNOSIS — I10 Essential (primary) hypertension: Secondary | ICD-10-CM | POA: Diagnosis not present

## 2017-03-06 DIAGNOSIS — M545 Low back pain: Secondary | ICD-10-CM | POA: Diagnosis not present

## 2017-03-07 DIAGNOSIS — Z1212 Encounter for screening for malignant neoplasm of rectum: Secondary | ICD-10-CM | POA: Diagnosis not present

## 2017-03-07 DIAGNOSIS — Z1211 Encounter for screening for malignant neoplasm of colon: Secondary | ICD-10-CM | POA: Diagnosis not present

## 2017-03-15 DIAGNOSIS — M542 Cervicalgia: Secondary | ICD-10-CM | POA: Diagnosis not present

## 2017-03-15 DIAGNOSIS — M545 Low back pain: Secondary | ICD-10-CM | POA: Diagnosis not present

## 2017-03-15 DIAGNOSIS — M25562 Pain in left knee: Secondary | ICD-10-CM | POA: Diagnosis not present

## 2017-03-22 DIAGNOSIS — M542 Cervicalgia: Secondary | ICD-10-CM | POA: Diagnosis not present

## 2017-03-22 DIAGNOSIS — M545 Low back pain: Secondary | ICD-10-CM | POA: Diagnosis not present

## 2017-03-22 DIAGNOSIS — M25562 Pain in left knee: Secondary | ICD-10-CM | POA: Diagnosis not present

## 2017-03-29 DIAGNOSIS — M545 Low back pain: Secondary | ICD-10-CM | POA: Diagnosis not present

## 2017-03-29 DIAGNOSIS — M542 Cervicalgia: Secondary | ICD-10-CM | POA: Diagnosis not present

## 2017-03-29 DIAGNOSIS — M25562 Pain in left knee: Secondary | ICD-10-CM | POA: Diagnosis not present

## 2017-04-11 DIAGNOSIS — E7800 Pure hypercholesterolemia, unspecified: Secondary | ICD-10-CM | POA: Diagnosis not present

## 2017-04-11 DIAGNOSIS — I10 Essential (primary) hypertension: Secondary | ICD-10-CM | POA: Diagnosis not present

## 2017-04-11 DIAGNOSIS — E1100 Type 2 diabetes mellitus with hyperosmolarity without nonketotic hyperglycemic-hyperosmolar coma (NKHHC): Secondary | ICD-10-CM | POA: Diagnosis not present

## 2017-04-24 DIAGNOSIS — M542 Cervicalgia: Secondary | ICD-10-CM | POA: Diagnosis not present

## 2017-04-24 DIAGNOSIS — M545 Low back pain: Secondary | ICD-10-CM | POA: Diagnosis not present

## 2017-04-24 DIAGNOSIS — M25562 Pain in left knee: Secondary | ICD-10-CM | POA: Diagnosis not present

## 2017-06-19 DIAGNOSIS — E1100 Type 2 diabetes mellitus with hyperosmolarity without nonketotic hyperglycemic-hyperosmolar coma (NKHHC): Secondary | ICD-10-CM | POA: Diagnosis not present

## 2017-06-19 DIAGNOSIS — E782 Mixed hyperlipidemia: Secondary | ICD-10-CM | POA: Diagnosis not present

## 2017-06-19 DIAGNOSIS — E039 Hypothyroidism, unspecified: Secondary | ICD-10-CM | POA: Diagnosis not present

## 2017-06-19 DIAGNOSIS — E6609 Other obesity due to excess calories: Secondary | ICD-10-CM | POA: Diagnosis not present

## 2017-06-19 DIAGNOSIS — E118 Type 2 diabetes mellitus with unspecified complications: Secondary | ICD-10-CM | POA: Diagnosis not present

## 2017-06-19 DIAGNOSIS — I10 Essential (primary) hypertension: Secondary | ICD-10-CM | POA: Diagnosis not present

## 2017-06-24 DIAGNOSIS — E782 Mixed hyperlipidemia: Secondary | ICD-10-CM | POA: Diagnosis not present

## 2017-06-24 DIAGNOSIS — E1100 Type 2 diabetes mellitus with hyperosmolarity without nonketotic hyperglycemic-hyperosmolar coma (NKHHC): Secondary | ICD-10-CM | POA: Diagnosis not present

## 2017-06-24 DIAGNOSIS — E6609 Other obesity due to excess calories: Secondary | ICD-10-CM | POA: Diagnosis not present

## 2017-06-24 DIAGNOSIS — I10 Essential (primary) hypertension: Secondary | ICD-10-CM | POA: Diagnosis not present

## 2017-07-17 DIAGNOSIS — E1100 Type 2 diabetes mellitus with hyperosmolarity without nonketotic hyperglycemic-hyperosmolar coma (NKHHC): Secondary | ICD-10-CM | POA: Diagnosis not present

## 2017-07-17 DIAGNOSIS — E782 Mixed hyperlipidemia: Secondary | ICD-10-CM | POA: Diagnosis not present

## 2017-07-17 DIAGNOSIS — E6609 Other obesity due to excess calories: Secondary | ICD-10-CM | POA: Diagnosis not present

## 2017-07-17 DIAGNOSIS — Z0000 Encounter for general adult medical examination without abnormal findings: Secondary | ICD-10-CM | POA: Diagnosis not present

## 2017-08-16 DIAGNOSIS — E039 Hypothyroidism, unspecified: Secondary | ICD-10-CM | POA: Diagnosis not present

## 2017-08-16 DIAGNOSIS — I10 Essential (primary) hypertension: Secondary | ICD-10-CM | POA: Diagnosis not present

## 2017-08-16 DIAGNOSIS — E669 Obesity, unspecified: Secondary | ICD-10-CM | POA: Diagnosis not present

## 2017-09-20 DIAGNOSIS — I10 Essential (primary) hypertension: Secondary | ICD-10-CM | POA: Diagnosis not present

## 2017-09-20 DIAGNOSIS — E782 Mixed hyperlipidemia: Secondary | ICD-10-CM | POA: Diagnosis not present

## 2017-09-20 DIAGNOSIS — E108 Type 1 diabetes mellitus with unspecified complications: Secondary | ICD-10-CM | POA: Diagnosis not present

## 2017-09-20 DIAGNOSIS — E669 Obesity, unspecified: Secondary | ICD-10-CM | POA: Diagnosis not present

## 2017-10-17 DIAGNOSIS — E669 Obesity, unspecified: Secondary | ICD-10-CM | POA: Diagnosis not present

## 2017-10-17 DIAGNOSIS — E1100 Type 2 diabetes mellitus with hyperosmolarity without nonketotic hyperglycemic-hyperosmolar coma (NKHHC): Secondary | ICD-10-CM | POA: Diagnosis not present

## 2017-10-17 DIAGNOSIS — E6609 Other obesity due to excess calories: Secondary | ICD-10-CM | POA: Diagnosis not present

## 2017-10-17 DIAGNOSIS — I10 Essential (primary) hypertension: Secondary | ICD-10-CM | POA: Diagnosis not present

## 2017-11-13 DIAGNOSIS — E782 Mixed hyperlipidemia: Secondary | ICD-10-CM | POA: Diagnosis not present

## 2017-11-13 DIAGNOSIS — E039 Hypothyroidism, unspecified: Secondary | ICD-10-CM | POA: Diagnosis not present

## 2017-11-13 DIAGNOSIS — E1100 Type 2 diabetes mellitus with hyperosmolarity without nonketotic hyperglycemic-hyperosmolar coma (NKHHC): Secondary | ICD-10-CM | POA: Diagnosis not present

## 2017-11-13 DIAGNOSIS — I10 Essential (primary) hypertension: Secondary | ICD-10-CM | POA: Diagnosis not present

## 2017-12-02 DIAGNOSIS — H524 Presbyopia: Secondary | ICD-10-CM | POA: Diagnosis not present

## 2017-12-18 DIAGNOSIS — E118 Type 2 diabetes mellitus with unspecified complications: Secondary | ICD-10-CM | POA: Diagnosis not present

## 2017-12-18 DIAGNOSIS — E1100 Type 2 diabetes mellitus with hyperosmolarity without nonketotic hyperglycemic-hyperosmolar coma (NKHHC): Secondary | ICD-10-CM | POA: Diagnosis not present

## 2018-02-20 DIAGNOSIS — E782 Mixed hyperlipidemia: Secondary | ICD-10-CM | POA: Diagnosis not present

## 2018-02-20 DIAGNOSIS — E669 Obesity, unspecified: Secondary | ICD-10-CM | POA: Diagnosis not present

## 2018-02-20 DIAGNOSIS — E039 Hypothyroidism, unspecified: Secondary | ICD-10-CM | POA: Diagnosis not present

## 2018-02-20 DIAGNOSIS — I10 Essential (primary) hypertension: Secondary | ICD-10-CM | POA: Diagnosis not present

## 2018-02-20 DIAGNOSIS — E1100 Type 2 diabetes mellitus with hyperosmolarity without nonketotic hyperglycemic-hyperosmolar coma (NKHHC): Secondary | ICD-10-CM | POA: Diagnosis not present

## 2018-02-28 DIAGNOSIS — Z0000 Encounter for general adult medical examination without abnormal findings: Secondary | ICD-10-CM | POA: Diagnosis not present

## 2018-03-07 DIAGNOSIS — I10 Essential (primary) hypertension: Secondary | ICD-10-CM | POA: Diagnosis not present

## 2018-03-07 DIAGNOSIS — E1100 Type 2 diabetes mellitus with hyperosmolarity without nonketotic hyperglycemic-hyperosmolar coma (NKHHC): Secondary | ICD-10-CM | POA: Diagnosis not present

## 2018-04-21 DIAGNOSIS — I10 Essential (primary) hypertension: Secondary | ICD-10-CM | POA: Diagnosis not present

## 2018-04-21 DIAGNOSIS — E1100 Type 2 diabetes mellitus with hyperosmolarity without nonketotic hyperglycemic-hyperosmolar coma (NKHHC): Secondary | ICD-10-CM | POA: Diagnosis not present

## 2018-07-22 DIAGNOSIS — E6609 Other obesity due to excess calories: Secondary | ICD-10-CM | POA: Diagnosis not present

## 2018-07-22 DIAGNOSIS — E785 Hyperlipidemia, unspecified: Secondary | ICD-10-CM | POA: Diagnosis not present

## 2018-07-22 DIAGNOSIS — E119 Type 2 diabetes mellitus without complications: Secondary | ICD-10-CM | POA: Diagnosis not present

## 2018-07-22 DIAGNOSIS — Z6835 Body mass index (BMI) 35.0-35.9, adult: Secondary | ICD-10-CM | POA: Diagnosis not present

## 2018-07-22 DIAGNOSIS — E7800 Pure hypercholesterolemia, unspecified: Secondary | ICD-10-CM | POA: Diagnosis not present

## 2018-07-22 DIAGNOSIS — E782 Mixed hyperlipidemia: Secondary | ICD-10-CM | POA: Diagnosis not present

## 2018-07-22 DIAGNOSIS — I10 Essential (primary) hypertension: Secondary | ICD-10-CM | POA: Diagnosis not present

## 2018-08-20 DIAGNOSIS — E1100 Type 2 diabetes mellitus with hyperosmolarity without nonketotic hyperglycemic-hyperosmolar coma (NKHHC): Secondary | ICD-10-CM | POA: Diagnosis not present

## 2018-09-19 DIAGNOSIS — E7800 Pure hypercholesterolemia, unspecified: Secondary | ICD-10-CM | POA: Diagnosis not present

## 2018-09-19 DIAGNOSIS — E039 Hypothyroidism, unspecified: Secondary | ICD-10-CM | POA: Diagnosis not present

## 2018-09-19 DIAGNOSIS — Z1321 Encounter for screening for nutritional disorder: Secondary | ICD-10-CM | POA: Diagnosis not present

## 2018-09-19 DIAGNOSIS — I10 Essential (primary) hypertension: Secondary | ICD-10-CM | POA: Diagnosis not present

## 2018-09-19 DIAGNOSIS — R799 Abnormal finding of blood chemistry, unspecified: Secondary | ICD-10-CM | POA: Diagnosis not present

## 2018-09-19 DIAGNOSIS — Z125 Encounter for screening for malignant neoplasm of prostate: Secondary | ICD-10-CM | POA: Diagnosis not present

## 2018-09-19 DIAGNOSIS — E1100 Type 2 diabetes mellitus with hyperosmolarity without nonketotic hyperglycemic-hyperosmolar coma (NKHHC): Secondary | ICD-10-CM | POA: Diagnosis not present

## 2018-10-22 DIAGNOSIS — I10 Essential (primary) hypertension: Secondary | ICD-10-CM | POA: Diagnosis not present

## 2018-10-22 DIAGNOSIS — E785 Hyperlipidemia, unspecified: Secondary | ICD-10-CM | POA: Diagnosis not present

## 2018-10-22 DIAGNOSIS — E039 Hypothyroidism, unspecified: Secondary | ICD-10-CM | POA: Diagnosis not present

## 2018-10-22 DIAGNOSIS — E1100 Type 2 diabetes mellitus with hyperosmolarity without nonketotic hyperglycemic-hyperosmolar coma (NKHHC): Secondary | ICD-10-CM | POA: Diagnosis not present

## 2018-11-11 DIAGNOSIS — H2511 Age-related nuclear cataract, right eye: Secondary | ICD-10-CM | POA: Diagnosis not present

## 2018-11-11 DIAGNOSIS — H2513 Age-related nuclear cataract, bilateral: Secondary | ICD-10-CM | POA: Diagnosis not present

## 2018-11-11 DIAGNOSIS — E113513 Type 2 diabetes mellitus with proliferative diabetic retinopathy with macular edema, bilateral: Secondary | ICD-10-CM | POA: Diagnosis not present

## 2018-11-11 DIAGNOSIS — H3582 Retinal ischemia: Secondary | ICD-10-CM | POA: Diagnosis not present

## 2018-11-11 DIAGNOSIS — H2512 Age-related nuclear cataract, left eye: Secondary | ICD-10-CM | POA: Diagnosis not present

## 2018-11-21 DIAGNOSIS — H2512 Age-related nuclear cataract, left eye: Secondary | ICD-10-CM | POA: Diagnosis not present

## 2018-11-21 DIAGNOSIS — H25811 Combined forms of age-related cataract, right eye: Secondary | ICD-10-CM | POA: Diagnosis not present

## 2018-11-21 DIAGNOSIS — Z01818 Encounter for other preprocedural examination: Secondary | ICD-10-CM | POA: Diagnosis not present

## 2018-11-21 DIAGNOSIS — E113513 Type 2 diabetes mellitus with proliferative diabetic retinopathy with macular edema, bilateral: Secondary | ICD-10-CM | POA: Diagnosis not present

## 2018-11-21 DIAGNOSIS — H2511 Age-related nuclear cataract, right eye: Secondary | ICD-10-CM | POA: Diagnosis not present

## 2019-02-12 DIAGNOSIS — E119 Type 2 diabetes mellitus without complications: Secondary | ICD-10-CM | POA: Diagnosis not present

## 2019-02-12 DIAGNOSIS — E039 Hypothyroidism, unspecified: Secondary | ICD-10-CM | POA: Diagnosis not present

## 2019-02-12 DIAGNOSIS — E669 Obesity, unspecified: Secondary | ICD-10-CM | POA: Diagnosis not present

## 2019-02-12 DIAGNOSIS — I10 Essential (primary) hypertension: Secondary | ICD-10-CM | POA: Diagnosis not present

## 2019-02-12 DIAGNOSIS — E1169 Type 2 diabetes mellitus with other specified complication: Secondary | ICD-10-CM | POA: Diagnosis not present

## 2019-02-12 DIAGNOSIS — E782 Mixed hyperlipidemia: Secondary | ICD-10-CM | POA: Diagnosis not present

## 2019-02-20 DIAGNOSIS — H4311 Vitreous hemorrhage, right eye: Secondary | ICD-10-CM | POA: Diagnosis not present

## 2019-02-20 DIAGNOSIS — H3342 Traction detachment of retina, left eye: Secondary | ICD-10-CM | POA: Diagnosis not present

## 2019-02-25 DIAGNOSIS — E103532 Type 1 diabetes mellitus with proliferative diabetic retinopathy with traction retinal detachment not involving the macula, left eye: Secondary | ICD-10-CM | POA: Diagnosis not present

## 2019-02-25 DIAGNOSIS — H35372 Puckering of macula, left eye: Secondary | ICD-10-CM | POA: Diagnosis not present

## 2019-02-25 DIAGNOSIS — E103531 Type 1 diabetes mellitus with proliferative diabetic retinopathy with traction retinal detachment not involving the macula, right eye: Secondary | ICD-10-CM | POA: Diagnosis not present

## 2019-02-25 DIAGNOSIS — H4311 Vitreous hemorrhage, right eye: Secondary | ICD-10-CM | POA: Diagnosis not present

## 2019-03-16 DIAGNOSIS — Z0000 Encounter for general adult medical examination without abnormal findings: Secondary | ICD-10-CM | POA: Diagnosis not present

## 2019-03-16 DIAGNOSIS — I10 Essential (primary) hypertension: Secondary | ICD-10-CM | POA: Diagnosis not present

## 2019-03-30 DIAGNOSIS — E1169 Type 2 diabetes mellitus with other specified complication: Secondary | ICD-10-CM | POA: Diagnosis not present

## 2019-03-30 DIAGNOSIS — E782 Mixed hyperlipidemia: Secondary | ICD-10-CM | POA: Diagnosis not present

## 2019-03-30 DIAGNOSIS — E039 Hypothyroidism, unspecified: Secondary | ICD-10-CM | POA: Diagnosis not present

## 2019-04-10 IMAGING — CT CT ABD-PELV W/O CM
2 of 4 series · 16 of 46 positions shown, 18 images · non-contrast
Comparison: Abdominal ultrasound performed 06/30/2013, and lumbar
spine radiographs performed 11/19/2016

CLINICAL DATA: Subacute onset of right flank pain and lower back
pain. Initial encounter.

EXAM:
CT ABDOMEN AND PELVIS WITHOUT CONTRAST
TECHNIQUE: Multidetector CT imaging of the abdomen and pelvis was performed
following the standard protocol without IV contrast.

[Series 3: abd/ pelvis 5.0 i30f 2 · axial · 0.85mm/px · z∈[-466,-52]mm · 13 of 91 slices shown, 15 images]
[im 4/91  soft-tissue]
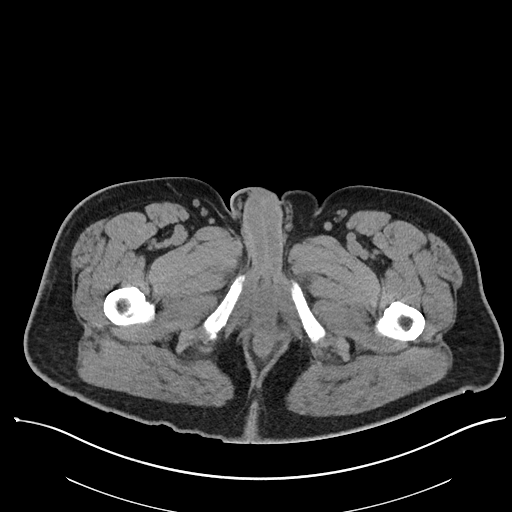
[im 4/91  bone]
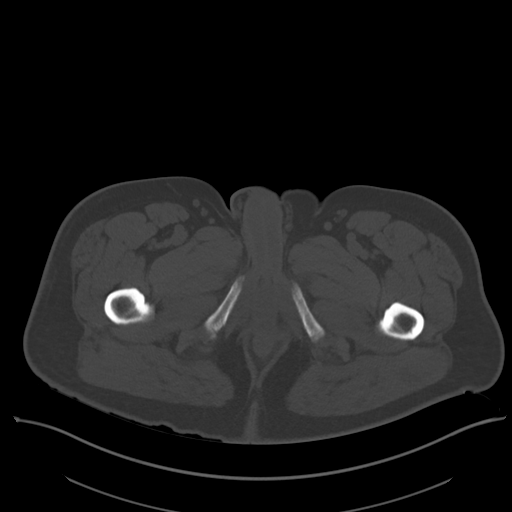
[im 12/91  soft-tissue]
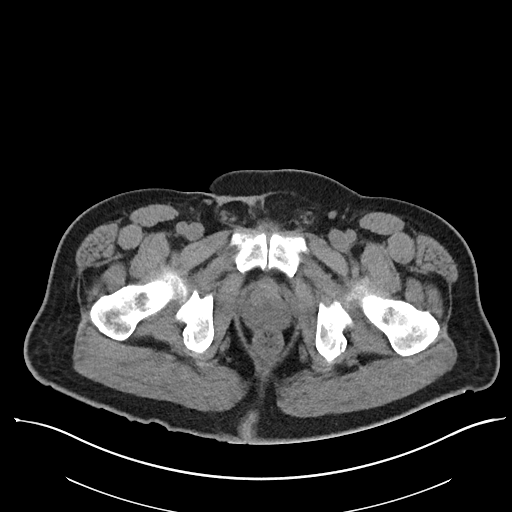
[im 19/91  soft-tissue]
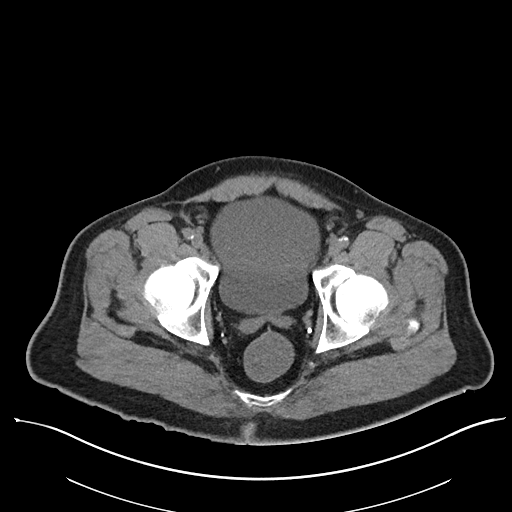
[im 27/91  soft-tissue]
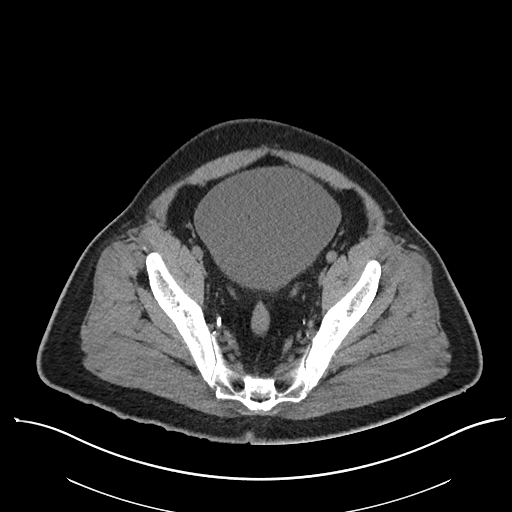
[im 31/91  soft-tissue]
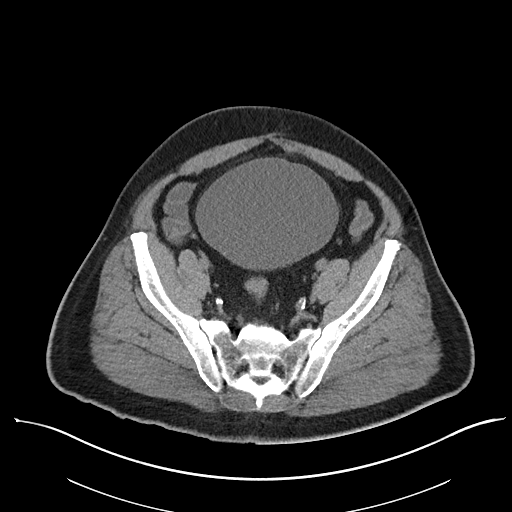
[im 38/91  soft-tissue]
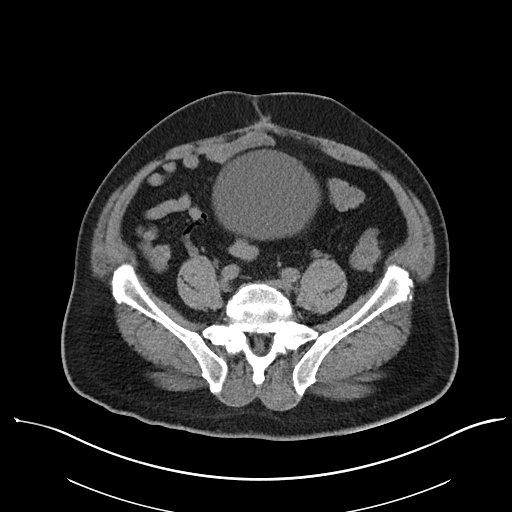
[im 46/91  soft-tissue]
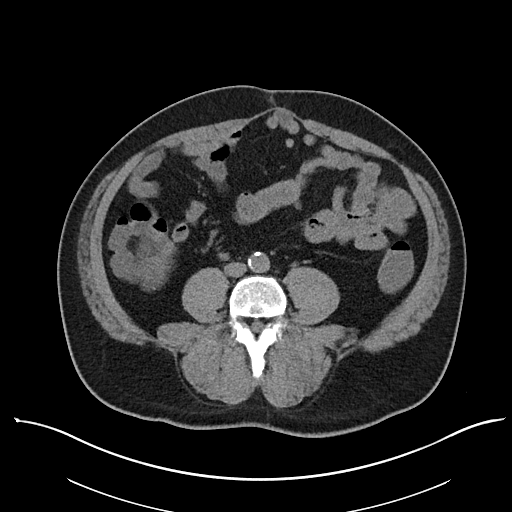
[im 53/91  soft-tissue]
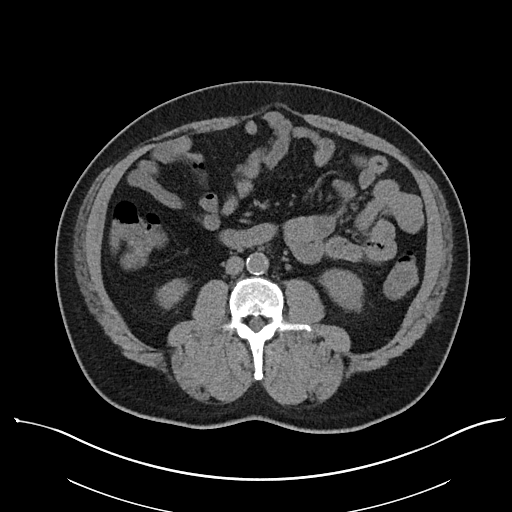
[im 61/91  soft-tissue]
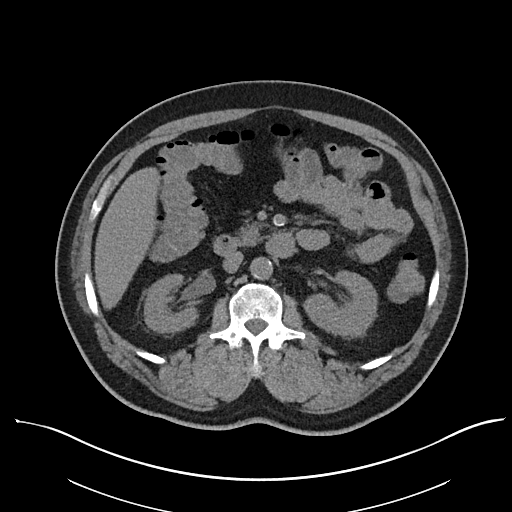
[im 61/91  bone]
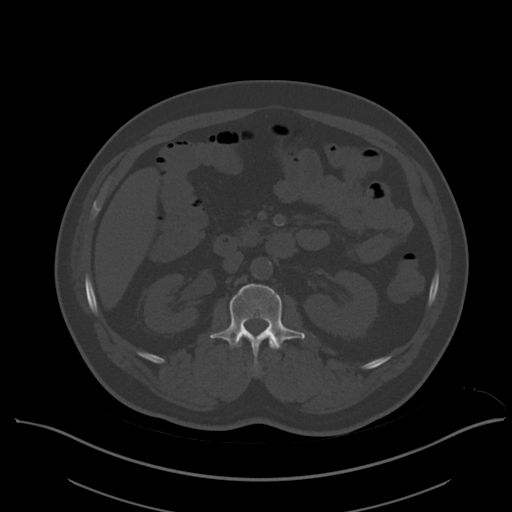
[im 64/91  soft-tissue]
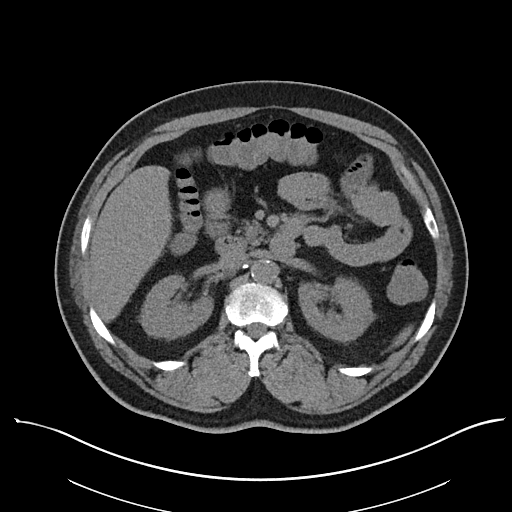
[im 72/91  soft-tissue]
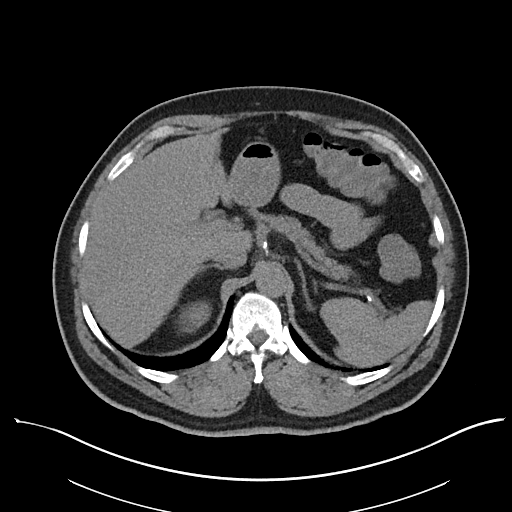
[im 79/91  soft-tissue]
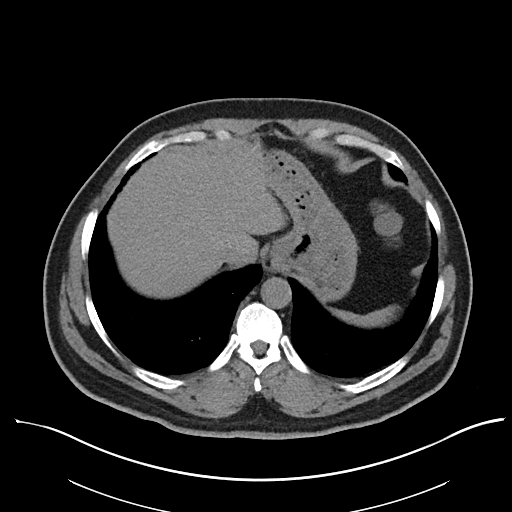
[im 87/91  soft-tissue]
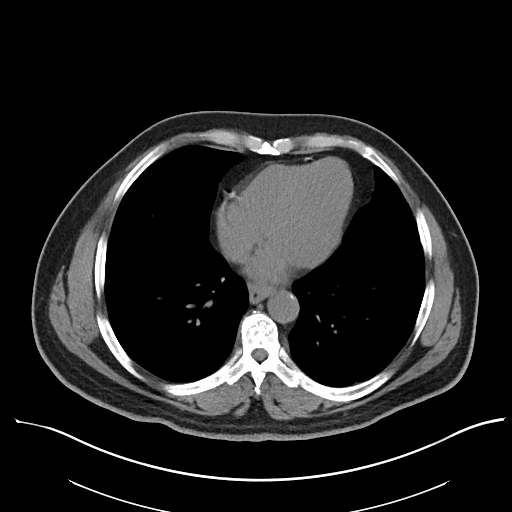

[Series 6: cor st · coronal · 0.75mm/px · 3 of 115 slices shown]
[im 39/115  soft-tissue]
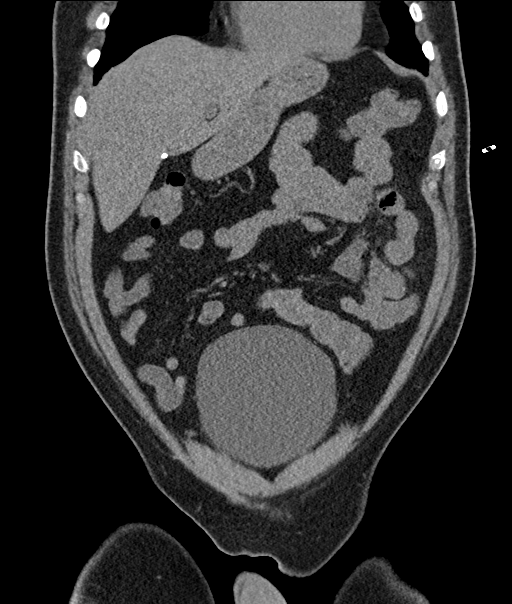
[im 51/115  soft-tissue]
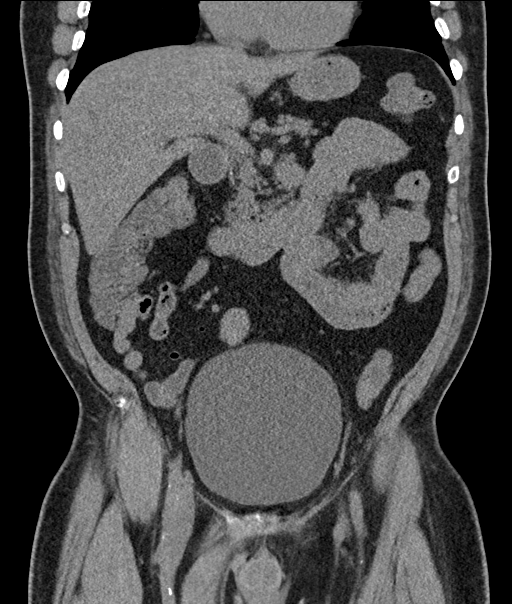
[im 64/115  soft-tissue]
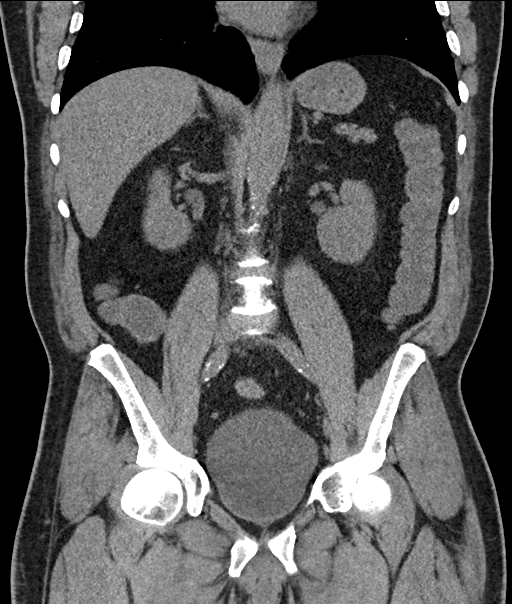

[16 of 46 positions shown; findings below may reference images not displayed]

FINDINGS: Lower chest: The visualized lung bases are grossly clear. The
visualized portions of the mediastinum are unremarkable.

Hepatobiliary: The liver is unremarkable in appearance. The patient
is status post cholecystectomy, with clips noted at the gallbladder
fossa. The common bile duct remains normal in caliber.

Pancreas: The pancreas is within normal limits.

Spleen: The spleen is unremarkable in appearance.

Adrenals/Urinary Tract: The adrenal glands are unremarkable in
appearance.

Nonspecific perinephric stranding is noted bilaterally. There is no
evidence of hydronephrosis. No renal or ureteral stones are
identified.

Stomach/Bowel: The stomach is unremarkable in appearance. The small
bowel is within normal limits. The appendix is normal in caliber,
without evidence of appendicitis. The colon is unremarkable in
appearance.

Vascular/Lymphatic: Scattered calcification is seen along the
abdominal aorta and its branches. The abdominal aorta is otherwise
grossly unremarkable. The inferior vena cava is grossly
unremarkable. No retroperitoneal lymphadenopathy is seen. No pelvic
sidewall lymphadenopathy is identified.

Reproductive: The bladder is significantly distended. The prostate
remains normal in size.

Other: No additional soft tissue abnormalities are seen.

Musculoskeletal: No acute osseous abnormalities are identified. The
visualized musculature is unremarkable in appearance.
IMPRESSION: 1. No acute abnormality seen in the abdomen or pelvis.
2. Scattered aortic atherosclerosis.

## 2019-04-13 DIAGNOSIS — E113531 Type 2 diabetes mellitus with proliferative diabetic retinopathy with traction retinal detachment not involving the macula, right eye: Secondary | ICD-10-CM | POA: Diagnosis not present

## 2019-04-13 DIAGNOSIS — H35372 Puckering of macula, left eye: Secondary | ICD-10-CM | POA: Diagnosis not present

## 2019-04-13 DIAGNOSIS — E103531 Type 1 diabetes mellitus with proliferative diabetic retinopathy with traction retinal detachment not involving the macula, right eye: Secondary | ICD-10-CM | POA: Diagnosis not present

## 2019-04-13 DIAGNOSIS — H4311 Vitreous hemorrhage, right eye: Secondary | ICD-10-CM | POA: Diagnosis not present

## 2019-04-13 DIAGNOSIS — H35371 Puckering of macula, right eye: Secondary | ICD-10-CM | POA: Diagnosis not present

## 2019-04-14 DIAGNOSIS — E103531 Type 1 diabetes mellitus with proliferative diabetic retinopathy with traction retinal detachment not involving the macula, right eye: Secondary | ICD-10-CM | POA: Diagnosis not present

## 2019-04-14 DIAGNOSIS — H4311 Vitreous hemorrhage, right eye: Secondary | ICD-10-CM | POA: Diagnosis not present

## 2019-04-21 DIAGNOSIS — E103532 Type 1 diabetes mellitus with proliferative diabetic retinopathy with traction retinal detachment not involving the macula, left eye: Secondary | ICD-10-CM | POA: Diagnosis not present

## 2019-04-21 DIAGNOSIS — E103531 Type 1 diabetes mellitus with proliferative diabetic retinopathy with traction retinal detachment not involving the macula, right eye: Secondary | ICD-10-CM | POA: Diagnosis not present

## 2019-04-21 DIAGNOSIS — H35372 Puckering of macula, left eye: Secondary | ICD-10-CM | POA: Diagnosis not present

## 2019-04-21 DIAGNOSIS — H35353 Cystoid macular degeneration, bilateral: Secondary | ICD-10-CM | POA: Diagnosis not present

## 2019-05-15 DIAGNOSIS — E103531 Type 1 diabetes mellitus with proliferative diabetic retinopathy with traction retinal detachment not involving the macula, right eye: Secondary | ICD-10-CM | POA: Diagnosis not present

## 2019-05-15 DIAGNOSIS — H4311 Vitreous hemorrhage, right eye: Secondary | ICD-10-CM | POA: Diagnosis not present

## 2019-07-08 DIAGNOSIS — E785 Hyperlipidemia, unspecified: Secondary | ICD-10-CM | POA: Diagnosis not present

## 2019-07-08 DIAGNOSIS — E1169 Type 2 diabetes mellitus with other specified complication: Secondary | ICD-10-CM | POA: Diagnosis not present

## 2019-07-08 DIAGNOSIS — I10 Essential (primary) hypertension: Secondary | ICD-10-CM | POA: Diagnosis not present

## 2019-07-08 DIAGNOSIS — I4891 Unspecified atrial fibrillation: Secondary | ICD-10-CM | POA: Diagnosis not present

## 2019-07-08 DIAGNOSIS — R0602 Shortness of breath: Secondary | ICD-10-CM | POA: Diagnosis not present

## 2019-07-08 DIAGNOSIS — E669 Obesity, unspecified: Secondary | ICD-10-CM | POA: Diagnosis not present

## 2019-07-08 DIAGNOSIS — E119 Type 2 diabetes mellitus without complications: Secondary | ICD-10-CM | POA: Diagnosis not present

## 2019-10-13 DIAGNOSIS — Z01818 Encounter for other preprocedural examination: Secondary | ICD-10-CM | POA: Diagnosis not present

## 2019-10-13 DIAGNOSIS — H2512 Age-related nuclear cataract, left eye: Secondary | ICD-10-CM | POA: Diagnosis not present

## 2019-10-13 DIAGNOSIS — E113513 Type 2 diabetes mellitus with proliferative diabetic retinopathy with macular edema, bilateral: Secondary | ICD-10-CM | POA: Diagnosis not present

## 2019-10-22 DIAGNOSIS — H25812 Combined forms of age-related cataract, left eye: Secondary | ICD-10-CM | POA: Diagnosis not present

## 2019-10-22 DIAGNOSIS — H2512 Age-related nuclear cataract, left eye: Secondary | ICD-10-CM | POA: Diagnosis not present

## 2019-11-06 DIAGNOSIS — E103531 Type 1 diabetes mellitus with proliferative diabetic retinopathy with traction retinal detachment not involving the macula, right eye: Secondary | ICD-10-CM | POA: Diagnosis not present

## 2019-11-06 DIAGNOSIS — E103522 Type 1 diabetes mellitus with proliferative diabetic retinopathy with traction retinal detachment involving the macula, left eye: Secondary | ICD-10-CM | POA: Diagnosis not present

## 2019-11-06 DIAGNOSIS — H4312 Vitreous hemorrhage, left eye: Secondary | ICD-10-CM | POA: Diagnosis not present

## 2019-11-06 DIAGNOSIS — H35372 Puckering of macula, left eye: Secondary | ICD-10-CM | POA: Diagnosis not present

## 2019-11-12 DIAGNOSIS — H3342 Traction detachment of retina, left eye: Secondary | ICD-10-CM | POA: Diagnosis not present

## 2019-11-12 DIAGNOSIS — E103522 Type 1 diabetes mellitus with proliferative diabetic retinopathy with traction retinal detachment involving the macula, left eye: Secondary | ICD-10-CM | POA: Diagnosis not present

## 2019-11-12 DIAGNOSIS — E113522 Type 2 diabetes mellitus with proliferative diabetic retinopathy with traction retinal detachment involving the macula, left eye: Secondary | ICD-10-CM | POA: Diagnosis not present

## 2019-11-12 DIAGNOSIS — H4312 Vitreous hemorrhage, left eye: Secondary | ICD-10-CM | POA: Diagnosis not present

## 2019-11-13 DIAGNOSIS — H35372 Puckering of macula, left eye: Secondary | ICD-10-CM | POA: Diagnosis not present

## 2019-11-13 DIAGNOSIS — H4312 Vitreous hemorrhage, left eye: Secondary | ICD-10-CM | POA: Diagnosis not present

## 2019-11-13 DIAGNOSIS — E103522 Type 1 diabetes mellitus with proliferative diabetic retinopathy with traction retinal detachment involving the macula, left eye: Secondary | ICD-10-CM | POA: Diagnosis not present

## 2019-11-24 DIAGNOSIS — H4312 Vitreous hemorrhage, left eye: Secondary | ICD-10-CM | POA: Diagnosis not present

## 2019-11-24 DIAGNOSIS — H35372 Puckering of macula, left eye: Secondary | ICD-10-CM | POA: Diagnosis not present

## 2019-11-27 DIAGNOSIS — E11630 Type 2 diabetes mellitus with periodontal disease: Secondary | ICD-10-CM | POA: Diagnosis not present

## 2019-11-27 DIAGNOSIS — E1151 Type 2 diabetes mellitus with diabetic peripheral angiopathy without gangrene: Secondary | ICD-10-CM | POA: Diagnosis not present

## 2019-11-27 DIAGNOSIS — E1142 Type 2 diabetes mellitus with diabetic polyneuropathy: Secondary | ICD-10-CM | POA: Diagnosis not present

## 2019-11-27 DIAGNOSIS — Z79899 Other long term (current) drug therapy: Secondary | ICD-10-CM | POA: Diagnosis not present

## 2019-11-27 DIAGNOSIS — Z125 Encounter for screening for malignant neoplasm of prostate: Secondary | ICD-10-CM | POA: Diagnosis not present

## 2019-11-27 DIAGNOSIS — I5032 Chronic diastolic (congestive) heart failure: Secondary | ICD-10-CM | POA: Diagnosis not present

## 2019-11-27 DIAGNOSIS — E1136 Type 2 diabetes mellitus with diabetic cataract: Secondary | ICD-10-CM | POA: Diagnosis not present

## 2019-11-27 DIAGNOSIS — I130 Hypertensive heart and chronic kidney disease with heart failure and stage 1 through stage 4 chronic kidney disease, or unspecified chronic kidney disease: Secondary | ICD-10-CM | POA: Diagnosis not present

## 2019-11-27 DIAGNOSIS — E039 Hypothyroidism, unspecified: Secondary | ICD-10-CM | POA: Diagnosis not present

## 2019-12-01 DIAGNOSIS — E103522 Type 1 diabetes mellitus with proliferative diabetic retinopathy with traction retinal detachment involving the macula, left eye: Secondary | ICD-10-CM | POA: Diagnosis not present

## 2020-12-08 ENCOUNTER — Ambulatory Visit: Payer: Medicare HMO | Admitting: Podiatry

## 2020-12-08 ENCOUNTER — Encounter: Payer: Self-pay | Admitting: Podiatry

## 2020-12-08 ENCOUNTER — Other Ambulatory Visit: Payer: Self-pay

## 2020-12-08 DIAGNOSIS — E1151 Type 2 diabetes mellitus with diabetic peripheral angiopathy without gangrene: Secondary | ICD-10-CM

## 2020-12-08 DIAGNOSIS — M79674 Pain in right toe(s): Secondary | ICD-10-CM

## 2020-12-08 DIAGNOSIS — B351 Tinea unguium: Secondary | ICD-10-CM

## 2020-12-08 DIAGNOSIS — M79675 Pain in left toe(s): Secondary | ICD-10-CM | POA: Diagnosis not present

## 2020-12-08 NOTE — Progress Notes (Signed)
Subjective:   Patient ID: [name], male   DOB: 70 y.o.   MRN: [id number]   HPI Patient presents with caregiver with elongated nailbeds 1-5 both feet that are thick incurvated and he cannot cut.  States is been going on for a long time and patient does not smoke likes to be active and has diabetes that he does a reasonable job of controlling   Review of Systems  All other systems reviewed and are negative.       Objective:  Physical Exam Vitals and nursing note reviewed.  Constitutional:      Appearance: He is well-developed and well-nourished.  Cardiovascular:     Pulses: Intact distal pulses.  Pulmonary:     Effort: Pulmonary effort is normal.  Musculoskeletal:        General: Normal range of motion.  Skin:    General: Skin is warm.  Neurological:     Mental Status: He is alert.     Neurovascular status intact muscle strength adequate range of motion adequate.  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

## 2021-04-11 ENCOUNTER — Ambulatory Visit (INDEPENDENT_AMBULATORY_CARE_PROVIDER_SITE_OTHER): Payer: Medicare HMO | Admitting: Podiatry

## 2021-04-11 ENCOUNTER — Other Ambulatory Visit: Payer: Self-pay

## 2021-04-11 ENCOUNTER — Encounter: Payer: Self-pay | Admitting: Podiatry

## 2021-04-11 DIAGNOSIS — M79674 Pain in right toe(s): Secondary | ICD-10-CM | POA: Diagnosis not present

## 2021-04-11 DIAGNOSIS — M79675 Pain in left toe(s): Secondary | ICD-10-CM | POA: Diagnosis not present

## 2021-04-11 DIAGNOSIS — B351 Tinea unguium: Secondary | ICD-10-CM | POA: Diagnosis not present

## 2021-04-11 DIAGNOSIS — E1151 Type 2 diabetes mellitus with diabetic peripheral angiopathy without gangrene: Secondary | ICD-10-CM | POA: Diagnosis not present

## 2021-04-11 NOTE — Progress Notes (Signed)
This patient returns to my office for at risk foot care.  This patient requires this care by a professional since this patient will be at risk due to having diabetes.  This patient is unable to cut nails himself since the patient cannot reach his nails.These nails are painful walking and wearing shoes.  This patient presents for at risk foot care today.  General Appearance  Alert, conversant and in no acute stress.  Vascular  Dorsalis pedis and posterior tibial  pulses are  weakly palpable  bilaterally.  Capillary return is within normal limits  bilaterally. Temperature is within normal limits  bilaterally.  Neurologic  Senn-Weinstein monofilament wire test within normal limits/diminished   bilaterally. Muscle power within normal limits bilaterally.  Nails Thick disfigured discolored nails with subungual debris  from hallux to fifth toes bilaterally. No evidence of bacterial infection or drainage bilaterally.  Orthopedic  No limitations of motion  feet .  No crepitus or effusions noted.  No bony pathology or digital deformities noted.  HAV  B/L  Skin  normotropic skin with no porokeratosis noted bilaterally.  No signs of infections or ulcers noted.     Onychomycosis  Pain in right toes  Pain in left toes  Consent was obtained for treatment procedures.   Mechanical debridement of nails 1-5  bilaterally performed with a nail nipper.  Filed with dremel without incident.    Return office visit        3 months              Told patient to return for periodic foot care and evaluation due to potential at risk complications.   Helane Gunther DPM

## 2021-07-11 ENCOUNTER — Ambulatory Visit: Payer: Medicare HMO | Admitting: Podiatry

## 2022-08-04 ENCOUNTER — Encounter (HOSPITAL_COMMUNITY): Payer: Self-pay | Admitting: Emergency Medicine

## 2022-08-04 ENCOUNTER — Ambulatory Visit (HOSPITAL_COMMUNITY)
Admission: EM | Admit: 2022-08-04 | Discharge: 2022-08-04 | Disposition: A | Discharge status: Home / Self Care | Payer: Medicare HMO | Attending: Internal Medicine | Admitting: Internal Medicine

## 2022-08-04 DIAGNOSIS — S161XXA Strain of muscle, fascia and tendon at neck level, initial encounter: Secondary | ICD-10-CM

## 2022-08-04 DIAGNOSIS — S39012A Strain of muscle, fascia and tendon of lower back, initial encounter: Secondary | ICD-10-CM

## 2022-08-04 MED ORDER — METHOCARBAMOL 500 MG PO TABS: 500.0000 mg | ORAL_TABLET | Freq: Two times a day (BID) | ORAL | 0 refills | Status: DC

## 2022-08-04 MED ORDER — ACETAMINOPHEN 325 MG PO TABS
ORAL_TABLET | ORAL | Status: AC
  Filled 2022-08-04: qty 3

## 2022-08-04 MED ORDER — ACETAMINOPHEN 325 MG PO TABS
975.0000 mg | ORAL_TABLET | Freq: Once | ORAL | Status: AC
  Administered 2022-08-04: 975 mg via ORAL
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
## 2022-08-04 NOTE — Discharge Instructions (Signed)
You have been evaluated in the today for your neck and back pain. Your pain is most likely muscle strain which will improve on its own with time.   Take tylenol 1,000mg  every 6 hours as needed for pain.  You may also take robaxin muscle relaxer every 12 hours as needed for muscle spasm.  Do not take this medication and drive or drink alcohol as it can make you sleepy.  Mainly use this medicine at nighttime as needed.  Apply heat and perform gentle range of motion exercises to the area of greatest pain to prevent muscle stiffness and provide further pain relief.   Follow-up with your primary care provider or return to urgent care if your symptoms do not improve in the next 3 to 4 days with medications and interventions recommended today.  If you develop any new or worsening symptoms, please return to urgent care.  If your symptoms are severe, please go to the emergency room.  I hope you feel better!
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
## 2022-08-07 ENCOUNTER — Other Ambulatory Visit: Payer: Self-pay | Admitting: Family

## 2022-08-07 ENCOUNTER — Ambulatory Visit
Admission: RE | Admit: 2022-08-07 | Discharge: 2022-08-07 | Disposition: A | Discharge status: Home / Self Care | Payer: Medicare HMO | Source: Ambulatory Visit | Attending: Family | Admitting: Family

## 2022-08-07 DIAGNOSIS — M542 Cervicalgia: Secondary | ICD-10-CM

## 2022-08-07 DIAGNOSIS — M25512 Pain in left shoulder: Secondary | ICD-10-CM

## 2022-08-07 DIAGNOSIS — M549 Dorsalgia, unspecified: Secondary | ICD-10-CM

## 2022-08-10 ENCOUNTER — Other Ambulatory Visit: Payer: Self-pay | Admitting: Family

## 2022-08-10 DIAGNOSIS — M1990 Unspecified osteoarthritis, unspecified site: Secondary | ICD-10-CM

## 2022-09-02 ENCOUNTER — Other Ambulatory Visit: Payer: Medicare HMO

## 2022-10-05 ENCOUNTER — Ambulatory Visit
Admission: RE | Admit: 2022-10-05 | Discharge: 2022-10-05 | Disposition: A | Discharge status: Home / Self Care | Payer: Medicare HMO | Source: Ambulatory Visit | Attending: Family | Admitting: Family

## 2022-10-05 DIAGNOSIS — M1990 Unspecified osteoarthritis, unspecified site: Secondary | ICD-10-CM

## 2022-11-06 ENCOUNTER — Other Ambulatory Visit: Payer: Self-pay | Admitting: Neurological Surgery

## 2022-11-12 NOTE — Pre-Procedure Instructions (Signed)
Surgical Instructions    Your procedure is scheduled on Tuesday, February 13th.  Report to [facility] Main Entrance "A" at 5:30 A.M., then check in with the Admitting office.  Call this number if you have problems the morning of surgery:  [phone]  If you have any questions prior to your surgery date call [phone]: Open Monday-Friday 8am-4pm If you experience any cold or flu symptoms such as cough, fever, chills, shortness of breath, etc. between now and your scheduled surgery, please notify us at the above number.     Remember:  Do not eat after midnight the night before your surgery  You may drink clear liquids until 4:30 a.m. the morning of your surgery.   Clear liquids allowed are: Water, Non-Citrus Juices (without pulp), Carbonated Beverages, Clear Tea, Black Coffee Only (NO MILK, CREAM OR POWDERED CREAMER of any kind), and Gatorade.    Take these medicines the morning of surgery with A SIP OF WATER  atorvastatin (LIPITOR)  levothyroxine (SYNTHROID, LEVOTHROID)  tamsulosin (FLOMAX)    Take these medications AS NEEDED: Eye drops nitroGLYCERIN (NITROSTAT)-please let a nurse know if you had to use this.  Follow your surgeon's instructions on when to stop Aspirin.  If no instructions were given by your surgeon then you will need to call the office to get those instructions.    As of today, STOP taking any Aspirin (unless otherwise instructed by your surgeon) Aleve, Naproxen, Ibuprofen, Motrin, Advil, Goody's, BC's, all herbal medications, fish oil, and all vitamins.          WHAT DO I DO ABOUT MY DIABETES MEDICATION?   Do not take glipiZIDE (GLUCOTROL) or metFORMIN (GLUCOPHAGE) the morning of surgery.  THE NIGHT BEFORE SURGERY, DO NOT TAKE your BEDTIME dose of insulin aspart (NOVOLOG FLEXPEN).      THE MORNING OF SURGERY, If your CBG is greater than 220 mg/dL, you may take  (18 units) of your insulin aspart (NOVOLOG FLEXPEN) dose of insulin.  The day of surgery, do  not take other diabetes injectables, including SOLIQUA. If you take SOLIQUA at night, please DO NOT inject a dose the night before surgery.   HOW TO MANAGE YOUR DIABETES BEFORE AND AFTER SURGERY  Why is it important to control my blood sugar before and after surgery? Improving blood sugar levels before and after surgery helps healing and can limit problems. A way of improving blood sugar control is eating a healthy diet by:  Eating less sugar and carbohydrates  Increasing activity/exercise  Talking with your doctor about reaching your blood sugar goals High blood sugars (greater than 180 mg/dL) can raise your risk of infections and slow your recovery, so you will need to focus on controlling your diabetes during the weeks before surgery. Make sure that the doctor who takes care of your diabetes knows about your planned surgery including the date and location.  How do I manage my blood sugar before surgery? Check your blood sugar at least 4 times a day, starting 2 days before surgery, to make sure that the level is not too high or low.  Check your blood sugar the morning of your surgery when you wake up and every 2 hours until you get to the Short Stay unit.  If your blood sugar is less than 70 mg/dL, you will need to treat for low blood sugar: Do not take insulin. Treat a low blood sugar (less than 70 mg/dL) with  cup of clear juice (cranberry or apple), 4 glucose tablets,  OR glucose gel. Recheck blood sugar in 15 minutes after treatment (to make sure it is greater than 70 mg/dL). If your blood sugar is not greater than 70 mg/dL on recheck, call [phone] for further instructions. Report your blood sugar to the short stay nurse when you get to Short Stay.  If you are admitted to the hospital after surgery: Your blood sugar will be checked by the staff and you will probably be given insulin after surgery (instead of oral diabetes medicines) to make sure you have good blood sugar  levels. The goal for blood sugar control after surgery is 80-180 mg/dL.             Do NOT Smoke (Tobacco/Vaping) for 24 hours prior to your procedure.  If you use a CPAP at night, you may bring your mask/headgear for your overnight stay.   Contacts, glasses, piercing's, hearing aid's, dentures or partials may not be worn into surgery, please bring cases for these belongings.    For patients admitted to the hospital, discharge time will be determined by your treatment team.   Patients discharged the day of surgery will not be allowed to drive home, and someone needs to stay with them for 24 hours.  SURGICAL WAITING ROOM VISITATION Patients having surgery or a procedure may have no more than 2 support people in the waiting area - these visitors may rotate.   Children under the age of 62 must have an adult with them who is not the patient. If the patient needs to stay at the hospital during part of their recovery, the visitor guidelines for inpatient rooms apply. Pre-op nurse will coordinate an appropriate time for 1 support person to accompany patient in pre-op.  This support person may not rotate.   Please refer to the [facility] website for the visitor guidelines for Inpatients (after your surgery is over and you are in a regular room).    Special instructions:   [place]- Preparing For Surgery  Before surgery, you can play an important role. Because skin is not sterile, your skin needs to be as free of germs as possible. You can reduce the number of germs on your skin by washing with CHG (chlorahexidine gluconate) Soap before surgery.  CHG is an antiseptic cleaner which kills germs and bonds with the skin to continue killing germs even after washing.    Oral Hygiene is also important to reduce your risk of infection.  Remember - BRUSH YOUR TEETH THE MORNING OF SURGERY WITH YOUR REGULAR TOOTHPASTE  Please do not use if you have an allergy to CHG or antibacterial soaps. If your skin  becomes reddened/irritated stop using the CHG.  Do not shave (including legs and underarms) for at least 48 hours prior to first CHG shower. It is OK to shave your face.  Please follow these instructions carefully.   Shower the NIGHT BEFORE SURGERY and the MORNING OF SURGERY  If you chose to wash your hair, wash your hair first as usual with your normal shampoo.  After you shampoo, rinse your hair and body thoroughly to remove the shampoo.  Use CHG Soap as you would any other liquid soap. You can apply CHG directly to the skin and wash gently with a scrungie or a clean washcloth.   Apply the CHG Soap to your body ONLY FROM THE NECK DOWN.  Do not use on open wounds or open sores. Avoid contact with your eyes, ears, mouth and genitals (private parts). Wash Face and  genitals (private parts)  with your normal soap.   Wash thoroughly, paying special attention to the area where your surgery will be performed.  Thoroughly rinse your body with warm water from the neck down.  DO NOT shower/wash with your normal soap after using and rinsing off the CHG Soap.  Pat yourself dry with a CLEAN TOWEL.  Wear CLEAN PAJAMAS to bed the night before surgery  Place CLEAN SHEETS on your bed the night before your surgery  DO NOT SLEEP WITH PETS.   Day of Surgery: Take a shower with CHG soap. Do not wear jewelry Do not wear lotions, powders, colognes, or deodorant. Men may shave face and neck. Do not bring valuables to the hospital.  [facility] is not responsible for any belongings or valuables. Wear Clean/Comfortable clothing the morning of surgery Remember to brush your teeth WITH YOUR REGULAR TOOTHPASTE.   Please read over the following fact sheets that you were given.    If you received a COVID test during your pre-op visit  it is requested that you wear a mask when out in public, stay away from anyone that may not be feeling well and notify your surgeon if you develop symptoms. If you have  been in contact with anyone that has tested positive in the last 10 days please notify you surgeon.

## 2022-11-13 ENCOUNTER — Encounter (HOSPITAL_COMMUNITY): Payer: Self-pay

## 2022-11-13 ENCOUNTER — Encounter (HOSPITAL_COMMUNITY)
Admission: RE | Admit: 2022-11-13 | Discharge: 2022-11-13 | Disposition: A | Discharge status: Home / Self Care | Payer: Medicare HMO | Source: Ambulatory Visit | Attending: Neurological Surgery | Admitting: Neurological Surgery

## 2022-11-13 ENCOUNTER — Other Ambulatory Visit: Payer: Self-pay

## 2022-11-13 VITALS — BP 170/81 | HR 86 | Temp 98.4°F | Resp 18 | Ht 65.0 in | Wt 213.0 lb

## 2022-11-13 DIAGNOSIS — E785 Hyperlipidemia, unspecified: Secondary | ICD-10-CM | POA: Insufficient documentation

## 2022-11-13 DIAGNOSIS — Z9049 Acquired absence of other specified parts of digestive tract: Secondary | ICD-10-CM | POA: Insufficient documentation

## 2022-11-13 DIAGNOSIS — I10 Essential (primary) hypertension: Secondary | ICD-10-CM | POA: Diagnosis not present

## 2022-11-13 DIAGNOSIS — Z87891 Personal history of nicotine dependence: Secondary | ICD-10-CM | POA: Diagnosis not present

## 2022-11-13 DIAGNOSIS — I2510 Atherosclerotic heart disease of native coronary artery without angina pectoris: Secondary | ICD-10-CM | POA: Insufficient documentation

## 2022-11-13 DIAGNOSIS — Z01818 Encounter for other preprocedural examination: Secondary | ICD-10-CM | POA: Insufficient documentation

## 2022-11-13 DIAGNOSIS — M5000 Cervical disc disorder with myelopathy, unspecified cervical region: Secondary | ICD-10-CM | POA: Insufficient documentation

## 2022-11-13 DIAGNOSIS — E119 Type 2 diabetes mellitus without complications: Secondary | ICD-10-CM | POA: Diagnosis not present

## 2022-11-13 LAB — GLUCOSE, CAPILLARY: Glucose-Capillary: 259 mg/dL — ABNORMAL HIGH (ref 70–99)

## 2022-11-13 LAB — BASIC METABOLIC PANEL
Anion gap: 10 (ref 5–15)
BUN: 11 mg/dL (ref 8–23)
CO2: 26 mmol/L (ref 22–32)
Calcium: 9.7 mg/dL (ref 8.9–10.3)
Chloride: 104 mmol/L (ref 98–111)
Creatinine, Ser: 1.36 mg/dL — ABNORMAL HIGH (ref 0.61–1.24)
GFR, Estimated: 56 mL/min — ABNORMAL LOW (ref >60)
Glucose, Bld: 231 mg/dL — ABNORMAL HIGH (ref 70–99)
Potassium: 4.5 mmol/L (ref 3.5–5.1)
Sodium: 140 mmol/L (ref 135–145)

## 2022-11-13 LAB — CBC
HCT: 38.6 % — ABNORMAL LOW (ref 39.0–52.0)
Hemoglobin: 13 g/dL (ref 13.0–17.0)
MCH: 27.3 pg (ref 26.0–34.0)
MCHC: 33.7 g/dL (ref 30.0–36.0)
MCV: 80.9 fL (ref 80.0–100.0)
Platelets: 204 10*3/uL (ref 150–400)
RBC: 4.77 MIL/uL (ref 4.22–5.81)
RDW: 13.2 % (ref 11.5–15.5)
WBC: 6.2 10*3/uL (ref 4.0–10.5)
nRBC: 0 % (ref 0.0–0.2)

## 2022-11-13 LAB — TYPE AND SCREEN
ABO/RH(D): A POS
Antibody Screen: NEGATIVE

## 2022-11-13 LAB — SURGICAL PCR SCREEN
MRSA, PCR: NEGATIVE
Staphylococcus aureus: NEGATIVE

## 2022-11-13 LAB — HEMOGLOBIN A1C
Hgb A1c MFr Bld: 9.2 % — ABNORMAL HIGH (ref 4.8–5.6)
Mean Plasma Glucose: 217.34 mg/dL

## 2022-11-13 NOTE — Progress Notes (Signed)
   11/13/22 1119  OBSTRUCTIVE SLEEP APNEA  Have you ever been diagnosed with sleep apnea through a sleep study? No  Do you snore loudly (loud enough to be heard through closed doors)?  0  Do you often feel tired, fatigued, or sleepy during the daytime (such as falling asleep during driving or talking to someone)? 0  Has anyone observed you stop breathing during your sleep? 0  Do you have, or are you being treated for high blood pressure? 1  BMI more than 35 kg/m2? 1  Age > 50 (1-yes) 1  Neck circumference greater than:Male 16 inches or larger, Male 17inches or larger? 1  Male Gender (Yes=1) 1  Obstructive Sleep Apnea Score 5  Score 5 or greater  Results sent to PCP

## 2022-11-13 NOTE — Progress Notes (Signed)
PCP - Dustin Folks, NP Cardiologist - denies  PPM/ICD - n/a  Chest x-ray - n/a EKG - 11/13/22 Stress Test - 2015 ECHO - 2015 Cardiac Cath -denies   Sleep Study - denies CPAP - denies  CBG at PAT: 259. Pt had coffee with sugar, and cheese toast at 0900.  Fasting Blood Sugar -69-130  Checks Blood Sugar multiple times a day with sensor. Last A1C unknown date-pt states it was in the 9's.  Last dose of GLP1 agonist- n/a  GLP1 instructions: n/a  Blood Thinner Instructions: n/a Aspirin Instructions: Stop ASA 7 days prior to procedure. LD 11/10/22 per pt.   ERAS Protcol -clear liquids until 0430 DOS PRE-SURGERY Ensure or G2- none ordered.   COVID TEST- n/a  Anesthesia review: Yes, EKG review  Patient denies shortness of breath, fever, cough and chest pain at PAT appointment   All instructions explained to the patient, with a verbal understanding of the material. Patient agrees to go over the instructions while at home for a better understanding. Patient also instructed to self quarantine after being tested for COVID-19. The opportunity to ask questions was provided.

## 2022-11-15 ENCOUNTER — Encounter (HOSPITAL_COMMUNITY): Payer: Self-pay

## 2022-11-15 NOTE — Progress Notes (Signed)
Anesthesia Chart Review:  Case: [id number] Date/Time: 11/20/22 0716   Procedure: C3 to C5 ACDF w/Possible C4 Corpectomy - RM 21/Case [id number]   Anesthesia type: General   Pre-op diagnosis: Cervical disc disorder with myelopathy   Location: [facility] OR ROOM 21 / [place] [facility]   Surgeons: Judith Part, MD       DISCUSSION: Patient is a 72 year old male scheduled for the above procedure. Diagnosis: Myelopathy.  History includes former smoker (quit 03/08/04), HTN, HLD, DM2 (diagnosed 02/2008), necrotizing fascitis (s/p perineal debridement 04/2002, required suprapubic catheter and diverting end colostomy 04/08/02, split thickness skin graft to genitalia and lower abd 05/08/02; colostomy reversal 07/27/02), cholecystectomy (07/02/13). Labs trends suggest CKD (Cr ~ 1.3-1.70 since 09/2014 by [facility] labs.) He denied known prior history of CAD or MI. OSA screening score of 5.   His A1c is 9.2. He reported fasting CBGs ~ 69-130 and has a glucose sensor. He is on glipizide 10 mg daily, Novolog 100 Units/mL 35 units TID, metformin 1000 mg three times/week, Soliqua 60 units daily.  Preoperative DM instructions provided at PAT visit. A1c results communicated with Janett Billow at Dr. Colleen Can office. Cr 1.36.   He denied chest pain and SOB at PAT RN interview. Stable EKG. Reported last ASA 11/10/22. Anesthesia team to evaluate on the day of surgery.    VS: BP (!) 170/81   Pulse 86   Temp 36.9 C   Resp 18   Ht 5\' 5"  (1.651 m)   Wt 96.6 kg   SpO2 100%   BMI 35.45 kg/m  BP Readings from Last 3 Encounters:  11/13/22 (!) 170/81  08/04/22 (!) 147/78     PROVIDERS: Sonia Side., FNP is PCP  - He is not followed by cardiology. He did present in May 2009 with syncope and hypotension with concern for ACS, but instead found to be hyperglycemic with hyposmolarity/volume depleted and diagnosed with new onset diabetes. He had admissions for chest pain in 2014 and 2015 by Charolette Forward and Dixie Dials, MD, respectively, but  ruled out for MI and had stress tests during his admissions showing no ischemia or evidence of infarction.   LABS: Preoperative labs noted. See DISCUSSION. (all labs ordered are listed, but only abnormal results are displayed)  Labs Reviewed  GLUCOSE, CAPILLARY - Abnormal; Notable for the following components:      Result Value   Glucose-Capillary 259 (*)    All other components within normal limits  HEMOGLOBIN A1C - Abnormal; Notable for the following components:   Hgb A1c MFr Bld 9.2 (*)    All other components within normal limits  BASIC METABOLIC PANEL - Abnormal; Notable for the following components:   Glucose, Bld 231 (*)    Creatinine, Ser 1.36 (*)    GFR, Estimated 56 (*)    All other components within normal limits  CBC - Abnormal; Notable for the following components:   HCT 38.6 (*)    All other components within normal limits  SURGICAL PCR SCREEN  TYPE AND SCREEN    IMAGES: MRI C-spine 10/05/22: IMPRESSION: 1. Severe multifactorial spinal stenosis at C4-5 with AP diameter of the canal estimated at 4 mm. Effacement of the subarachnoid space and chronic compression of the cord. Abnormal T2 signal within the cord consistent with chronic compressive myelopathy. Severe bilateral foraminal stenosis could affect either C5 nerve. 2. Spinal stenosis at C3-4 with AP diameter of the canal only 6.5 mm. Effacement of the subarachnoid space and slight deformity of  the cord. Bilateral foraminal stenosis could affect either C4 nerve. 3. Lesser, non-compressive degenerative changes at the other levels as outlined above.   EKG: 11/13/22: Sinus rhythm with Premature atrial complexes Septal infarct , age undetermined Abnormal ECG No significant change since last tracing Confirmed by Vernell Leep [phone]) on 11/13/2022 2:00:29 PM   CV: Echo 09/10/14: Study Conclusions  - Left ventricle: The cavity size was normal. There was moderate concentric hypertrophy. Systolic function  was normal. Wall motion was normal; there were no regional wall motion abnormalities. Doppler parameters are consistent with abnormal left ventricular relaxation (grade 1 diastolic dysfunction).  Nuclear stress test 03/31/14: IMPRESSION:  1. No reversible ischemia or infarction.  2.  Normal wall motion.  3. Ejection fraction equals 57 %       Past Medical History:  Diagnosis Date   DM (diabetes mellitus) ([place])    HTN (hypertension)    Hyperlipemia     Past Surgical History:  Procedure Laterality Date   CHOLECYSTECTOMY N/A 07/02/2013   Procedure: LAPAROSCOPIC CHOLECYSTECTOMY WITH INTRAOPERATIVE CHOLANGIOGRAM ;  Surgeon: Gayland Curry, MD;  Location: [facility];  Service: General;  Laterality: N/A;   COLON SURGERY     diverting colostomy and I&D fourniers  04/10/02   Dr. Judeth Horn   reversal of colostomy  07/27/02    Dr. Judeth Horn   SKIN GRAFT  05/08/02   Dr. Harlow Mares    MEDICATIONS:  ascorbic acid (VITAMIN C) 500 MG tablet   aspirin EC 81 MG EC tablet   atorvastatin (LIPITOR) 20 MG tablet   Benfotiamine 150 MG CAPS   Cholecalciferol (VITAMIN D) 125 MCG (5000 UT) CAPS   Cyanocobalamin (B-12 PO)   glipiZIDE (GLUCOTROL) 10 MG tablet   insulin aspart (NOVOLOG FLEXPEN) 100 UNIT/ML SOPN FlexPen   levothyroxine (SYNTHROID, LEVOTHROID) 100 MCG tablet   losartan-hydrochlorothiazide (HYZAAR) 100-12.5 MG tablet   metFORMIN (GLUCOPHAGE) 1000 MG tablet   methocarbamol (ROBAXIN) 500 MG tablet   naphazoline-glycerin (CLEAR EYES REDNESS) 0.012-0.25 % SOLN   niacin (VITAMIN B3) 500 MG tablet   nitroGLYCERIN (NITROSTAT) 0.4 MG SL tablet   Omega-3 Fatty Acids (FISH OIL) 1000 MG CAPS   OVER THE COUNTER MEDICATION   OVER THE COUNTER MEDICATION   OVER THE COUNTER MEDICATION   OVER THE COUNTER MEDICATION   oxyCODONE-acetaminophen (PERCOCET/ROXICET) 5-325 MG tablet   SOLIQUA 100-33 UNT-MCG/ML SOPN   tamsulosin (FLOMAX) 0.4 MG CAPS capsule   No current facility-administered medications for this  encounter.    Myra Gianotti, PA-C Surgical Short Stay/Anesthesiology [facility] Phone [phone] [facility] Phone [phone] 11/15/2022 10:27 AM

## 2022-11-15 NOTE — Anesthesia Preprocedure Evaluation (Addendum)
Anesthesia Evaluation  Patient identified by MRN, date of birth, ID band Patient awake    Reviewed: Allergy & Precautions, H&P , NPO status , Patient's Chart, lab work & pertinent test results  Airway Mallampati: II  TM Distance: >3 FB Neck ROM: Full    Dental no notable dental hx. (+) Edentulous Upper, Edentulous Lower, Dental Advisory Given   Pulmonary neg pulmonary ROS, former smoker   Pulmonary exam normal breath sounds clear to auscultation       Cardiovascular Exercise Tolerance: Good hypertension, Pt. on medications  Rhythm:Regular Rate:Normal     Neuro/Psych negative neurological ROS  negative psych ROS   GI/Hepatic negative GI ROS, Neg liver ROS,,,  Endo/Other  diabetes, Insulin Dependent, Oral Hypoglycemic Agents  Morbid obesity  Renal/GU Renal InsufficiencyRenal disease  negative genitourinary   Musculoskeletal   Abdominal   Peds  Hematology negative hematology ROS (+)   Anesthesia Other Findings   Reproductive/Obstetrics negative OB ROS                             Anesthesia Physical Anesthesia Plan  ASA: 3  Anesthesia Plan: General   Post-op Pain Management: Tylenol PO (pre-op)*   Induction: Intravenous  PONV Risk Score and Plan: 3 and Ondansetron, Dexamethasone and Treatment may vary due to age or medical condition  Airway Management Planned: Oral ETT  Additional Equipment:   Intra-op Plan:   Post-operative Plan: Extubation in OR  Informed Consent: I have reviewed the patients History and Physical, chart, labs and discussed the procedure including the risks, benefits and alternatives for the proposed anesthesia with the patient or authorized representative who has indicated his/her understanding and acceptance.     Dental advisory given  Plan Discussed with: CRNA  Anesthesia Plan Comments: (PAT note written 11/15/2022 by Myra Gianotti, PA-C.  )        Anesthesia Quick Evaluation

## 2022-11-20 ENCOUNTER — Inpatient Hospital Stay (HOSPITAL_COMMUNITY): Payer: Medicare HMO

## 2022-11-20 ENCOUNTER — Other Ambulatory Visit: Payer: Self-pay

## 2022-11-20 ENCOUNTER — Inpatient Hospital Stay (HOSPITAL_COMMUNITY): Payer: Medicare HMO | Admitting: Anesthesiology

## 2022-11-20 ENCOUNTER — Encounter (HOSPITAL_COMMUNITY)
Admission: RE | Disposition: A | Discharge status: Home / Self Care | Payer: Self-pay | Source: Home / Self Care | Attending: Neurological Surgery

## 2022-11-20 ENCOUNTER — Inpatient Hospital Stay (HOSPITAL_COMMUNITY)
Admission: RE | Admit: 2022-11-20 | Discharge: 2022-11-21 | DRG: 030 | Disposition: A | Discharge status: Home / Self Care | Payer: Medicare HMO | Attending: Neurological Surgery | Admitting: Neurological Surgery

## 2022-11-20 ENCOUNTER — Inpatient Hospital Stay (HOSPITAL_COMMUNITY): Payer: Medicare HMO | Admitting: Vascular Surgery

## 2022-11-20 ENCOUNTER — Encounter (HOSPITAL_COMMUNITY): Payer: Self-pay | Admitting: Neurological Surgery

## 2022-11-20 DIAGNOSIS — Z87891 Personal history of nicotine dependence: Secondary | ICD-10-CM | POA: Diagnosis not present

## 2022-11-20 DIAGNOSIS — Z7984 Long term (current) use of oral hypoglycemic drugs: Secondary | ICD-10-CM

## 2022-11-20 DIAGNOSIS — Z6834 Body mass index (BMI) 34.0-34.9, adult: Secondary | ICD-10-CM

## 2022-11-20 DIAGNOSIS — E119 Type 2 diabetes mellitus without complications: Secondary | ICD-10-CM

## 2022-11-20 DIAGNOSIS — Z801 Family history of malignant neoplasm of trachea, bronchus and lung: Secondary | ICD-10-CM | POA: Diagnosis not present

## 2022-11-20 DIAGNOSIS — E1165 Type 2 diabetes mellitus with hyperglycemia: Secondary | ICD-10-CM | POA: Diagnosis not present

## 2022-11-20 DIAGNOSIS — Z825 Family history of asthma and other chronic lower respiratory diseases: Secondary | ICD-10-CM

## 2022-11-20 DIAGNOSIS — G959 Disease of spinal cord, unspecified: Principal | ICD-10-CM | POA: Diagnosis present

## 2022-11-20 DIAGNOSIS — I10 Essential (primary) hypertension: Secondary | ICD-10-CM

## 2022-11-20 DIAGNOSIS — Z794 Long term (current) use of insulin: Secondary | ICD-10-CM | POA: Diagnosis not present

## 2022-11-20 DIAGNOSIS — Z833 Family history of diabetes mellitus: Secondary | ICD-10-CM | POA: Diagnosis not present

## 2022-11-20 DIAGNOSIS — E785 Hyperlipidemia, unspecified: Secondary | ICD-10-CM | POA: Diagnosis present

## 2022-11-20 HISTORY — PX: ANTERIOR CERVICAL CORPECTOMY: SHX1159

## 2022-11-20 LAB — GLUCOSE, CAPILLARY
Glucose-Capillary: 178 mg/dL — ABNORMAL HIGH (ref 70–99)
Glucose-Capillary: 215 mg/dL — ABNORMAL HIGH (ref 70–99)
Glucose-Capillary: 166 mg/dL — ABNORMAL HIGH (ref 70–99)
Glucose-Capillary: 217 mg/dL — ABNORMAL HIGH (ref 70–99)
Glucose-Capillary: 306 mg/dL — ABNORMAL HIGH (ref 70–99)
Glucose-Capillary: 251 mg/dL — ABNORMAL HIGH (ref 70–99)

## 2022-11-20 LAB — ABO/RH: ABO/RH(D): A POS

## 2022-11-20 SURGERY — ANTERIOR CERVICAL CORPECTOMY
Anesthesia: General | Site: Spine Cervical

## 2022-11-20 MED ORDER — SUCCINYLCHOLINE CHLORIDE 200 MG/10ML IV SOSY
PREFILLED_SYRINGE | INTRAVENOUS | Status: DC | PRN
  Administered 2022-11-20: 160 mg via INTRAVENOUS

## 2022-11-20 MED ORDER — MENTHOL 3 MG MT LOZG: 1.0000 | LOZENGE | OROMUCOSAL | Status: DC | PRN

## 2022-11-20 MED ORDER — LACTATED RINGERS IV SOLN: INTRAVENOUS | Status: DC | PRN

## 2022-11-20 MED ORDER — LOSARTAN POTASSIUM 50 MG PO TABS
100.0000 mg | ORAL_TABLET | Freq: Every day | ORAL | Status: DC
  Administered 2022-11-20: 100 mg via ORAL
  Filled 2022-11-20: qty 2

## 2022-11-20 MED ORDER — PROPOFOL 10 MG/ML IV BOLUS
INTRAVENOUS | Status: AC
  Filled 2022-11-20: qty 20

## 2022-11-20 MED ORDER — ONDANSETRON HCL 4 MG/2ML IJ SOLN: 4.0000 mg | Freq: Four times a day (QID) | INTRAMUSCULAR | Status: DC | PRN

## 2022-11-20 MED ORDER — ACETAMINOPHEN 650 MG RE SUPP: 650.0000 mg | RECTAL | Status: DC | PRN

## 2022-11-20 MED ORDER — INSULIN ASPART 100 UNIT/ML IJ SOLN: 35.0000 [IU] | Freq: Three times a day (TID) | INTRAMUSCULAR | Status: DC

## 2022-11-20 MED ORDER — CYCLOBENZAPRINE HCL 10 MG PO TABS
10.0000 mg | ORAL_TABLET | Freq: Three times a day (TID) | ORAL | Status: DC | PRN
  Administered 2022-11-20 (×2): 10 mg via ORAL
  Filled 2022-11-20 (×2): qty 1

## 2022-11-20 MED ORDER — DEXAMETHASONE SODIUM PHOSPHATE 10 MG/ML IJ SOLN
INTRAMUSCULAR | Status: AC
  Filled 2022-11-20: qty 1

## 2022-11-20 MED ORDER — INSULIN GLARGINE-LIXISENATIDE 100-33 UNT-MCG/ML ~~LOC~~ SOPN: 60.0000 [IU] | PEN_INJECTOR | Freq: Every day | SUBCUTANEOUS | Status: DC

## 2022-11-20 MED ORDER — PHENYLEPHRINE HCL-NACL 20-0.9 MG/250ML-% IV SOLN
INTRAVENOUS | Status: DC | PRN
  Administered 2022-11-20: 50 ug/min via INTRAVENOUS

## 2022-11-20 MED ORDER — POLYETHYLENE GLYCOL 3350 17 G PO PACK: 17.0000 g | PACK | Freq: Every day | ORAL | Status: DC | PRN

## 2022-11-20 MED ORDER — ONDANSETRON HCL 4 MG/2ML IJ SOLN
INTRAMUSCULAR | Status: DC | PRN
  Administered 2022-11-20: 4 mg via INTRAVENOUS

## 2022-11-20 MED ORDER — PHENOL 1.4 % MT LIQD: 1.0000 | OROMUCOSAL | Status: DC | PRN

## 2022-11-20 MED ORDER — HYDROMORPHONE HCL 1 MG/ML IJ SOLN
1.0000 mg | INTRAMUSCULAR | Status: DC | PRN
  Administered 2022-11-20: 1 mg via INTRAVENOUS
  Filled 2022-11-20: qty 1

## 2022-11-20 MED ORDER — ROCURONIUM BROMIDE 10 MG/ML (PF) SYRINGE
PREFILLED_SYRINGE | INTRAVENOUS | Status: AC
  Filled 2022-11-20: qty 10

## 2022-11-20 MED ORDER — ACETAMINOPHEN 325 MG PO TABS: 650.0000 mg | ORAL_TABLET | ORAL | Status: DC | PRN

## 2022-11-20 MED ORDER — EPHEDRINE SULFATE-NACL 50-0.9 MG/10ML-% IV SOSY
PREFILLED_SYRINGE | INTRAVENOUS | Status: DC | PRN
  Administered 2022-11-20: 5 mg via INTRAVENOUS

## 2022-11-20 MED ORDER — PROPOFOL 1000 MG/100ML IV EMUL
INTRAVENOUS | Status: AC
  Filled 2022-11-20: qty 100

## 2022-11-20 MED ORDER — OXYCODONE HCL 5 MG PO TABS
10.0000 mg | ORAL_TABLET | ORAL | Status: DC | PRN
  Administered 2022-11-20 – 2022-11-21 (×3): 10 mg via ORAL
  Filled 2022-11-20 (×4): qty 2

## 2022-11-20 MED ORDER — LIDOCAINE-EPINEPHRINE 1 %-1:100000 IJ SOLN
INTRAMUSCULAR | Status: AC
  Filled 2022-11-20: qty 1

## 2022-11-20 MED ORDER — PHENYLEPHRINE HCL-NACL 20-0.9 MG/250ML-% IV SOLN
INTRAVENOUS | Status: AC
  Filled 2022-11-20: qty 500

## 2022-11-20 MED ORDER — HYDROMORPHONE HCL 1 MG/ML IJ SOLN: 0.2500 mg | INTRAMUSCULAR | Status: DC | PRN

## 2022-11-20 MED ORDER — SODIUM CHLORIDE 0.9 % IV SOLN: 250.0000 mL | INTRAVENOUS | Status: DC

## 2022-11-20 MED ORDER — PHENYLEPHRINE 80 MCG/ML (10ML) SYRINGE FOR IV PUSH (FOR BLOOD PRESSURE SUPPORT)
PREFILLED_SYRINGE | INTRAVENOUS | Status: AC
  Filled 2022-11-20: qty 10

## 2022-11-20 MED ORDER — LIDOCAINE-EPINEPHRINE 1 %-1:100000 IJ SOLN
INTRAMUSCULAR | Status: DC | PRN
  Administered 2022-11-20: 10 mL

## 2022-11-20 MED ORDER — FENTANYL CITRATE (PF) 250 MCG/5ML IJ SOLN
INTRAMUSCULAR | Status: AC
  Filled 2022-11-20: qty 5

## 2022-11-20 MED ORDER — CHLORHEXIDINE GLUCONATE 0.12 % MT SOLN
15.0000 mL | Freq: Once | OROMUCOSAL | Status: AC
  Administered 2022-11-20: 15 mL via OROMUCOSAL
  Filled 2022-11-20: qty 15

## 2022-11-20 MED ORDER — GLYCOPYRROLATE 0.2 MG/ML IJ SOLN
INTRAMUSCULAR | Status: DC | PRN
  Administered 2022-11-20: .2 mg via INTRAVENOUS

## 2022-11-20 MED ORDER — LEVOTHYROXINE SODIUM 100 MCG PO TABS
100.0000 ug | ORAL_TABLET | Freq: Every day | ORAL | Status: DC
  Administered 2022-11-21: 100 ug via ORAL
  Filled 2022-11-20: qty 1

## 2022-11-20 MED ORDER — 0.9 % SODIUM CHLORIDE (POUR BTL) OPTIME
TOPICAL | Status: DC | PRN
  Administered 2022-11-20: 1000 mL

## 2022-11-20 MED ORDER — CEFAZOLIN SODIUM-DEXTROSE 2-4 GM/100ML-% IV SOLN
2.0000 g | INTRAVENOUS | Status: AC
  Administered 2022-11-20: 2 g via INTRAVENOUS
  Filled 2022-11-20: qty 100

## 2022-11-20 MED ORDER — PHENYLEPHRINE 80 MCG/ML (10ML) SYRINGE FOR IV PUSH (FOR BLOOD PRESSURE SUPPORT)
PREFILLED_SYRINGE | INTRAVENOUS | Status: DC | PRN
  Administered 2022-11-20: 160 ug via INTRAVENOUS

## 2022-11-20 MED ORDER — INSULIN ASPART 100 UNIT/ML IJ SOLN
0.0000 [IU] | Freq: Three times a day (TID) | INTRAMUSCULAR | Status: DC
  Administered 2022-11-20: 11 [IU] via SUBCUTANEOUS
  Administered 2022-11-21: 3 [IU] via SUBCUTANEOUS

## 2022-11-20 MED ORDER — CEFAZOLIN SODIUM-DEXTROSE 2-4 GM/100ML-% IV SOLN
2.0000 g | Freq: Three times a day (TID) | INTRAVENOUS | Status: AC
  Administered 2022-11-20 – 2022-11-21 (×2): 2 g via INTRAVENOUS
  Filled 2022-11-20 (×2): qty 100

## 2022-11-20 MED ORDER — THROMBIN 5000 UNITS EX SOLR: OROMUCOSAL | Status: DC | PRN

## 2022-11-20 MED ORDER — FENTANYL CITRATE (PF) 250 MCG/5ML IJ SOLN
INTRAMUSCULAR | Status: DC | PRN
  Administered 2022-11-20 (×4): 50 ug via INTRAVENOUS

## 2022-11-20 MED ORDER — INSULIN GLARGINE-YFGN 100 UNIT/ML ~~LOC~~ SOLN
60.0000 [IU] | Freq: Every day | SUBCUTANEOUS | Status: DC
  Administered 2022-11-20: 60 [IU] via SUBCUTANEOUS
  Filled 2022-11-20 (×2): qty 0.6

## 2022-11-20 MED ORDER — DOCUSATE SODIUM 100 MG PO CAPS
100.0000 mg | ORAL_CAPSULE | Freq: Two times a day (BID) | ORAL | Status: DC
  Administered 2022-11-20: 100 mg via ORAL
  Filled 2022-11-20: qty 1

## 2022-11-20 MED ORDER — GLIPIZIDE 5 MG PO TABS
10.0000 mg | ORAL_TABLET | Freq: Every day | ORAL | Status: DC
  Administered 2022-11-21: 10 mg via ORAL
  Filled 2022-11-20: qty 2

## 2022-11-20 MED ORDER — HYDROCHLOROTHIAZIDE 12.5 MG PO TABS
12.5000 mg | ORAL_TABLET | Freq: Every day | ORAL | Status: DC
  Administered 2022-11-20: 12.5 mg via ORAL
  Filled 2022-11-20: qty 1

## 2022-11-20 MED ORDER — SODIUM CHLORIDE 0.9% FLUSH: 3.0000 mL | INTRAVENOUS | Status: DC | PRN

## 2022-11-20 MED ORDER — DEXAMETHASONE SODIUM PHOSPHATE 10 MG/ML IJ SOLN
INTRAMUSCULAR | Status: DC | PRN
  Administered 2022-11-20: 5 mg via INTRAVENOUS

## 2022-11-20 MED ORDER — INSULIN ASPART 100 UNIT/ML IJ SOLN
0.0000 [IU] | Freq: Every day | INTRAMUSCULAR | Status: DC
  Administered 2022-11-20: 3 [IU] via SUBCUTANEOUS

## 2022-11-20 MED ORDER — NITROGLYCERIN 0.4 MG SL SUBL: 0.4000 mg | SUBLINGUAL_TABLET | SUBLINGUAL | Status: DC | PRN

## 2022-11-20 MED ORDER — PROPOFOL 10 MG/ML IV BOLUS
INTRAVENOUS | Status: DC | PRN
  Administered 2022-11-20: 120 mg via INTRAVENOUS
  Administered 2022-11-20: 75 ug/kg/min via INTRAVENOUS

## 2022-11-20 MED ORDER — ORAL CARE MOUTH RINSE: 15.0000 mL | Freq: Once | OROMUCOSAL | Status: AC

## 2022-11-20 MED ORDER — LOSARTAN POTASSIUM-HCTZ 100-12.5 MG PO TABS: 1.0000 | ORAL_TABLET | Freq: Every day | ORAL | Status: DC

## 2022-11-20 MED ORDER — ONDANSETRON HCL 4 MG/2ML IJ SOLN
INTRAMUSCULAR | Status: AC
  Filled 2022-11-20: qty 2

## 2022-11-20 MED ORDER — CHLORHEXIDINE GLUCONATE CLOTH 2 % EX PADS: 6.0000 | MEDICATED_PAD | Freq: Once | CUTANEOUS | Status: DC

## 2022-11-20 MED ORDER — EPHEDRINE 5 MG/ML INJ
INTRAVENOUS | Status: AC
  Filled 2022-11-20: qty 5

## 2022-11-20 MED ORDER — LIDOCAINE 2% (20 MG/ML) 5 ML SYRINGE
INTRAMUSCULAR | Status: AC
  Filled 2022-11-20: qty 5

## 2022-11-20 MED ORDER — METFORMIN HCL 500 MG PO TABS: 1000.0000 mg | ORAL_TABLET | ORAL | Status: DC

## 2022-11-20 MED ORDER — OXYCODONE HCL 5 MG PO TABS
5.0000 mg | ORAL_TABLET | ORAL | Status: DC | PRN
  Administered 2022-11-20: 5 mg via ORAL
  Filled 2022-11-20: qty 1

## 2022-11-20 MED ORDER — LIDOCAINE 2% (20 MG/ML) 5 ML SYRINGE
INTRAMUSCULAR | Status: DC | PRN
  Administered 2022-11-20: 60 mg via INTRAVENOUS

## 2022-11-20 MED ORDER — THROMBIN 5000 UNITS EX SOLR
CUTANEOUS | Status: AC
  Filled 2022-11-20: qty 5000

## 2022-11-20 MED ORDER — LACTATED RINGERS IV SOLN: INTRAVENOUS | Status: DC

## 2022-11-20 MED ORDER — SODIUM CHLORIDE 0.9% FLUSH: 3.0000 mL | Freq: Two times a day (BID) | INTRAVENOUS | Status: DC

## 2022-11-20 MED ORDER — ONDANSETRON HCL 4 MG PO TABS: 4.0000 mg | ORAL_TABLET | Freq: Four times a day (QID) | ORAL | Status: DC | PRN

## 2022-11-20 MED ORDER — TAMSULOSIN HCL 0.4 MG PO CAPS
0.4000 mg | ORAL_CAPSULE | Freq: Every day | ORAL | Status: DC
  Administered 2022-11-20: 0.4 mg via ORAL
  Filled 2022-11-20: qty 1

## 2022-11-20 MED ORDER — ACETAMINOPHEN 500 MG PO TABS
1000.0000 mg | ORAL_TABLET | Freq: Once | ORAL | Status: AC
  Administered 2022-11-20: 1000 mg via ORAL
  Filled 2022-11-20: qty 2

## 2022-11-20 MED ORDER — ATORVASTATIN CALCIUM 10 MG PO TABS
20.0000 mg | ORAL_TABLET | Freq: Every day | ORAL | Status: DC
  Administered 2022-11-20: 20 mg via ORAL
  Filled 2022-11-20: qty 2

## 2022-11-20 MED ORDER — INSULIN ASPART 100 UNIT/ML IJ SOLN: 0.0000 [IU] | INTRAMUSCULAR | Status: DC | PRN

## 2022-11-20 SURGICAL SUPPLY — 61 items
ADH SKN CLS APL DERMABOND .7 (GAUZE/BANDAGES/DRESSINGS) ×1
BAG COUNTER SPONGE SURGICOUNT (BAG) ×2 IMPLANT
BAG SPNG CNTER NS LX DISP (BAG) ×1
BAND INSRT 18 STRL LF DISP RB (MISCELLANEOUS) ×2
BAND RUBBER #18 3X1/16 STRL (MISCELLANEOUS) ×4 IMPLANT
BLADE CLIPPER SURG (BLADE) IMPLANT
BLADE SURG 11 STRL SS (BLADE) ×2 IMPLANT
BUR MATCHSTICK NEURO 3.0 LAGG (BURR) ×2 IMPLANT
CAGE CORP CENTERPIECE B 13 (Cage) IMPLANT
CANISTER SUCT 3000ML PPV (MISCELLANEOUS) ×2 IMPLANT
DERMABOND ADVANCED .7 DNX12 (GAUZE/BANDAGES/DRESSINGS) ×2 IMPLANT
DRAPE C-ARM 42X72 X-RAY (DRAPES) ×4 IMPLANT
DRAPE HALF SHEET 40X57 (DRAPES) IMPLANT
DRAPE LAPAROTOMY 100X72 PEDS (DRAPES) ×2 IMPLANT
DRAPE MICROSCOPE SLANT 54X150 (MISCELLANEOUS) ×2 IMPLANT
DURAPREP 6ML APPLICATOR 50/CS (WOUND CARE) ×2 IMPLANT
ELECT COATED BLADE 2.86 ST (ELECTRODE) ×2 IMPLANT
ELECT REM PT RETURN 9FT ADLT (ELECTROSURGICAL) ×1
ELECTRODE REM PT RTRN 9FT ADLT (ELECTROSURGICAL) ×2 IMPLANT
FEE INTRAOP CADWELL SUPPLY NCS (MISCELLANEOUS) IMPLANT
FEE INTRAOP MONITOR IMPULS NCS (MISCELLANEOUS) IMPLANT
GAUZE 4X4 16PLY ~~LOC~~+RFID DBL (SPONGE) IMPLANT
GLOVE BIOGEL PI IND STRL 7.5 (GLOVE) ×4 IMPLANT
GLOVE ECLIPSE 7.5 STRL STRAW (GLOVE) ×2 IMPLANT
GLOVE EXAM NITRILE LRG STRL (GLOVE) IMPLANT
GLOVE EXAM NITRILE XL STR (GLOVE) IMPLANT
GLOVE EXAM NITRILE XS STR PU (GLOVE) IMPLANT
GOWN STRL REUS W/ TWL LRG LVL3 (GOWN DISPOSABLE) ×4 IMPLANT
GOWN STRL REUS W/ TWL XL LVL3 (GOWN DISPOSABLE) IMPLANT
GOWN STRL REUS W/TWL 2XL LVL3 (GOWN DISPOSABLE) IMPLANT
GOWN STRL REUS W/TWL LRG LVL3 (GOWN DISPOSABLE) ×2
GOWN STRL REUS W/TWL XL LVL3 (GOWN DISPOSABLE)
HEMOSTAT POWDER KIT SURGIFOAM (HEMOSTASIS) ×2 IMPLANT
INTRAOP CADWELL SUPPLY FEE NCS (MISCELLANEOUS) ×1
INTRAOP DISP SUPPLY FEE NCS (MISCELLANEOUS) ×1
INTRAOP MONITOR FEE IMPULS NCS (MISCELLANEOUS) ×1
INTRAOP MONITOR FEE IMPULSE (MISCELLANEOUS) ×1
KIT BASIN OR (CUSTOM PROCEDURE TRAY) ×2 IMPLANT
KIT TURNOVER KIT B (KITS) ×2 IMPLANT
NDL SPNL 18GX3.5 QUINCKE PK (NEEDLE) ×2 IMPLANT
NEEDLE HYPO 22GX1.5 SAFETY (NEEDLE) ×2 IMPLANT
NEEDLE SPNL 18GX3.5 QUINCKE PK (NEEDLE) ×1 IMPLANT
NS IRRIG 1000ML POUR BTL (IV SOLUTION) ×2 IMPLANT
PACK LAMINECTOMY NEURO (CUSTOM PROCEDURE TRAY) ×2 IMPLANT
PAD ARMBOARD 7.5X6 YLW CONV (MISCELLANEOUS) ×6 IMPLANT
PIN DISTRACTION 14MM (PIN) IMPLANT
PLATE 37MM IMPLANT
PUTTY DBX 2.5CC (Putty) ×1 IMPLANT
PUTTY DBX 2.5CC DEPUY (Putty) IMPLANT
SCREW SPINAL 4.0X14MM TITANIUM (Screw) IMPLANT
SPIKE FLUID TRANSFER (MISCELLANEOUS) ×2 IMPLANT
SPONGE INTESTINAL PEANUT (DISPOSABLE) ×2 IMPLANT
SPONGE SURGIFOAM ABS GEL SZ50 (HEMOSTASIS) IMPLANT
STAPLER VISISTAT 35W (STAPLE) IMPLANT
SUT MNCRL AB 3-0 PS2 18 (SUTURE) ×2 IMPLANT
SUT MNCRL AB 3-0 PS2 27 (SUTURE) IMPLANT
SUT VIC AB 3-0 SH 8-18 (SUTURE) ×2 IMPLANT
TAPE CLOTH 3X10 TAN LF (GAUZE/BANDAGES/DRESSINGS) ×2 IMPLANT
TOWEL GREEN STERILE (TOWEL DISPOSABLE) ×2 IMPLANT
TOWEL GREEN STERILE FF (TOWEL DISPOSABLE) ×2 IMPLANT
WATER STERILE IRR 1000ML POUR (IV SOLUTION) ×2 IMPLANT

## 2022-11-20 NOTE — Progress Notes (Signed)
Orthopedic Tech Progress Note Patient Details:  [name] Nov 27, 1950 [id number]  RN called requesting a SOFT COLLAR  Ortho Devices Type of Ortho Device: Soft collar Ortho Device/Splint Location: NECK Ortho Device/Splint Interventions: Ordered   Post Interventions Patient Tolerated: Well Instructions Provided: Care of device  Janit Pagan 11/20/2022, 3:23 PM

## 2022-11-20 NOTE — Transfer of Care (Signed)
Immediate Anesthesia Transfer of Care Note  Patient: [name]  Procedure(s) Performed: CERVICAL FOUR CORPECTOMY (Spine Cervical)  Patient Location: PACU  Anesthesia Type:General  Level of Consciousness: awake, alert , and oriented  Airway & Oxygen Therapy: Patient Spontanous Breathing and Patient connected to nasal cannula oxygen  Post-op Assessment: Report given to RN and Post -op Vital signs reviewed and stable  Post vital signs: Reviewed and stable  Last Vitals:  Vitals Value Taken Time  BP 143/75 11/20/22 1147  Temp    Pulse 98 11/20/22 1157  Resp 16 11/20/22 1157  SpO2 88 % 11/20/22 1157  Vitals shown include unvalidated device data.  Last Pain:  Vitals:   11/20/22 0624  TempSrc:   PainSc: 0-No pain      Patients Stated Pain Goal: 2 ([id number])  Complications: No notable events documented.
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
## 2022-11-20 NOTE — Inpatient Diabetes Management (Signed)
Inpatient Diabetes Program Recommendations  AACE/ADA: New Consensus Statement on Inpatient Glycemic Control (2015)  Target Ranges:  Prepandial:   less than 140 mg/dL      Peak postprandial:   less than 180 mg/dL (1-2 hours)      Critically ill patients:  140 - 180 mg/dL   Lab Results  Component Value Date   GLUCAP 217 (H) 11/20/2022   HGBA1C 9.2 (H) 11/13/2022    Review of Glycemic Control  Latest Reference Range & Units 11/20/22 05:48 11/20/22 06:24 11/20/22 07:13  Glucose-Capillary 70 - 99 mg/dL 215 (H) 178 (H) 166 (H)   Diabetes history: DM 2 Outpatient Diabetes medications: Glipizide 10 mg Daily, Soliqua 60 units Daily, Novolog 35 units tid, metformin 1000 mg 3x/week Current orders for Inpatient glycemic control: In perioperative area  A1c 9.2% on 2/6 Preadmission testing appt Received Decadron 5 mg during surgery.  Inpatient Diabetes Program Recommendations:    Willeen Niece not on formulary unless pt brought from home  -  Semglee 65 units -  Novolog 0-20 units tid + hs -  Novolog 15 units tid meal coverage (1/2 home dose) -  Glipizide 10 mg Daily  Will touch base with pt when on 3C regarding A1c and wound healing.  Spoke with pt and wife at bedside briefly regarding A1c level. Reviewed current A1c level of 9.2%. stressed importance control of glucose control on wound healing. Discussed when to call PCP for medication titration for glucose control during this time.   Spoke with wife about Willeen Niece medication not being on formulary. Asked if it could be brought to the hospital for the morning dose administration. Explained how medication can be stored in pharmacy and returned at time of d/c. Wife reports their son can bring it from home.   Thanks,  Tama Headings RN, MSN, BC-ADM Inpatient Diabetes Coordinator Team Pager [phone] (8a-5p)

## 2022-11-20 NOTE — Inpatient Diabetes Management (Signed)
Inpatient Diabetes Program Recommendations  AACE/ADA: New Consensus Statement on Inpatient Glycemic Control (2015)  Target Ranges:  Prepandial:   less than 140 mg/dL      Peak postprandial:   less than 180 mg/dL (1-2 hours)      Critically ill patients:  140 - 180 mg/dL   Lab Results  Component Value Date   GLUCAP 166 (H) 11/20/2022   HGBA1C 9.2 (H) 11/13/2022    Review of Glycemic Control  Latest Reference Range & Units 11/20/22 05:48 11/20/22 06:24 11/20/22 07:13  Glucose-Capillary 70 - 99 mg/dL 215 (H) 178 (H) 166 (H)   Diabetes history: DM 2 Outpatient Diabetes medications: Glipizide 10 mg Daily, Soliqua 60 units Daily, Novolog 35 units tid, metformin 1000 mg 3x/week Current orders for Inpatient glycemic control: In perioperative area  A1c 9.2% on 2/6 Preadmission testing appt Received Decadron 5 mg during surgery.  Inpatient Diabetes Program Recommendations:    Willeen Niece not on formulary unless pt brought from home  -  Semglee 65 units -  Novolog 0-20 units tid + hs -  Novolog 15 units tid meal coverage (1/2 home dose) -  Glipizide 10 mg Daily  Will touch base with pt when on 3C regarding A1c and wound healing.  Thanks,  Tama Headings RN, MSN, BC-ADM Inpatient Diabetes Coordinator Team Pager [phone] (8a-5p)

## 2022-11-20 NOTE — Anesthesia Postprocedure Evaluation (Signed)
Anesthesia Post Note  Patient: Goeffrey Berchtold  Procedure(s) Performed: CERVICAL FOUR CORPECTOMY (Spine Cervical)     Patient location during evaluation: PACU Anesthesia Type: General Level of consciousness: awake and alert Pain management: pain level controlled Vital Signs Assessment: post-procedure vital signs reviewed and stable Respiratory status: spontaneous breathing, nonlabored ventilation, respiratory function stable and patient connected to nasal cannula oxygen Cardiovascular status: blood pressure returned to baseline and stable Postop Assessment: no apparent nausea or vomiting Anesthetic complications: no  No notable events documented.  Last Vitals:  Vitals:   11/20/22 1215 11/20/22 1230  BP: (!) 143/70 (!) 141/68  Pulse: 93 86  Resp: 18 (!) 21  Temp:    SpO2: 96% 99%    Last Pain:  Vitals:   11/20/22 1147  TempSrc:   PainSc: 0-No pain                 Anquanette Bahner,W. EDMOND

## 2022-11-20 NOTE — Op Note (Signed)
PATIENT: [name]  PROCEDURE DATE: 11/20/22  PRE-OPERATIVE DIAGNOSIS:  Cervical myelopathy   POST-OPERATIVE DIAGNOSIS:  Same   PROCEDURE:  C4 Anterior Cervical Corpectomy and C3-C5 Instrumented Fusion   SURGEON:  Surgeon(s) and Role:    Judith Part, MD - Primary    Norm Parcel PA  - Assisting   ANESTHESIA: ETGA   BRIEF HISTORY: This is a 72 year old man who presented with bilateral upper extremity numbness with bilateral upper and lower extremity weakness. Workup showed cervical canal stenosis with cord signal change. This was discussed with the patient as well as risks, benefits, and alternatives and the patient wished to proceed with surgical treatment.   OPERATIVE DETAIL: The patient was taken to the operating room and placed on the OR table in the supine position. A formal time out was performed with two patient identifiers and confirmed the operative site. Anesthesia was induced by the anesthesia team. Baseline SSEPs and MEPs were obtained. Fluoroscopy was used to localize the surgical level and an incision was marked in a skin crease. The area was then prepped and draped in a sterile fashion. A transverse linear incision was made on the right side of the neck. The platysma was divided and the sternocleidomastoid muscle was identified. The carotid sheath was palpated, identified, and retracted laterally with the sternocleidomastoid muscle. Of note, the tissues were quite firm and adherent along the dissection path. The strap muscles were identified and retracted medially and the pretracheal fascia was entered. A bent spinal needle was used with fluoroscopy to localize the surgical level after dissection. The longus colli were elevated bilaterally and a self-retaining retractor was placed. The endotracheal tube cuff balloon was deflated and reinflated after retractor placement.   Anterior osteophytes were removed until flush with the anterior vertebral body. The disc  annulus was incised and a complete C4-C5 discectomy was performed. The posterior longitudinal ligament was impressively adherent to the ventral dura and quite difficult to dissect. The canal was quite tight here and there was some decrement of the left upper extremity MEP while using a simple nerve hook, so we increased MAPs and I opted to proceed with corpectomy to dissect towards this area from a safer direction as well as decompress the cord faster. I therefore used a high speed drill to drill the C4 body out to the level of the PLL and up to the C3-4 disc space. I was able to develop a better plane roughly at the level of the the center of the C4 body, which I then used to dissect the PLL. It was still impressively adherent and had to be sharply dissected in multiple areas. Given how tight the canal was, I used 35m kerrisons and had to use longer reach due to the angle and patient's habitus, which made dissection more difficult.   With decompression complete, the endplates were prepped, and an expandable titanium interbody corpectomy cage was inserted into the disc space, expanded, and locked. Fluoroscopy showed acceptable position, and a plate was placed and secured to span C3-C5 with again fluoroscopy showing acceptable position. It was difficult to assess C5 well due to the patient's habitus, but I was able to freely pass a large nerve hook behind the interbody without contacting dura. Hardware was confirmed locked, levels were correct on fluoro, and attention was turned to closure.  Hemostasis was obtained and the incision was closed in layers. All instrument and sponge counts were correct. The patient was then returned to anesthesia for emergence.  No apparent complications at the completion of the procedure.   EBL:  139m   DRAINS: none   SPECIMENS: none   TJudith Part MD 11/20/22 7:34 AM

## 2022-11-20 NOTE — Anesthesia Procedure Notes (Signed)
Procedure Name: Intubation Date/Time: 11/20/2022 7:50 AM  Performed by: Roderic Palau, MDPre-anesthesia Checklist: Patient identified, Emergency Drugs available, Suction available, Patient being monitored and Timeout performed Patient Re-evaluated:Patient Re-evaluated prior to induction Oxygen Delivery Method: Circle system utilized Preoxygenation: Pre-oxygenation with 100% oxygen Induction Type: IV induction Ventilation: Mask ventilation without difficulty and Oral airway inserted - appropriate to patient size Laryngoscope Size: Glidescope and 4 Grade View: Grade I Tube type: Oral Tube size: 7.5 mm Number of attempts: 1 Airway Equipment and Method: Stylet, Rigid stylet and Oral airway Placement Confirmation: ETT inserted through vocal cords under direct vision, positive ETCO2 and breath sounds checked- equal and bilateral Secured at: 22 cm Tube secured with: Tape Dental Injury: Teeth and Oropharynx as per pre-operative assessment

## 2022-11-21 ENCOUNTER — Encounter (HOSPITAL_COMMUNITY): Payer: Self-pay | Admitting: Neurological Surgery

## 2022-11-21 LAB — GLUCOSE, CAPILLARY: Glucose-Capillary: 194 mg/dL — ABNORMAL HIGH (ref 70–99)

## 2022-11-21 MED ORDER — OXYCODONE HCL 5 MG PO TABS: 5.0000 mg | ORAL_TABLET | ORAL | 0 refills | Status: AC | PRN

## 2022-11-21 MED ORDER — CYCLOBENZAPRINE HCL 10 MG PO TABS: 10.0000 mg | ORAL_TABLET | Freq: Three times a day (TID) | ORAL | 0 refills | Status: AC | PRN

## 2022-11-21 NOTE — Progress Notes (Addendum)
Neurosurgery Service Progress Note  Subjective: No acute events overnight, BUE / BLE feel improved from preop, he's very pleased   Objective: Vitals:   11/20/22 2116 11/21/22 0126 11/21/22 0435 11/21/22 0745  BP: (!) 135/56 138/62 (!) 141/72 (!) 140/59  Pulse: 89 84 89 93  Resp:  18 18 16  $ Temp: 98.7 F (37.1 C) 98.4 F (36.9 C) 98.3 F (36.8 C) 98.3 F (36.8 C)  TempSrc: Oral Oral Oral Oral  SpO2: 93% 96% 93% 94%  Weight:      Height:        Physical Exam: Strength 5/5 x4 and SILTx4 except some improved but still residual R hand numbness, incision c/d/I, neck soft  Assessment & Plan: 72 y.o. man s/p C4 corpectomy for myelopathy, recovering well.  -discharge home today -some hyperglycemia overnight but not unexpected given preop A1C, will discharge on home regimen without change given expected endocrinologic changes 2/2 periop period  Judith Part  11/21/22 9:16 AM

## 2022-11-21 NOTE — Inpatient Diabetes Management (Addendum)
Inpatient Diabetes Program Recommendations  AACE/ADA: New Consensus Statement on Inpatient Glycemic Control (2015)  Target Ranges:  Prepandial:   less than 140 mg/dL      Peak postprandial:   less than 180 mg/dL (1-2 hours)      Critically ill patients:  140 - 180 mg/dL   Lab Results  Component Value Date   GLUCAP 194 (H) 11/21/2022   HGBA1C 9.2 (H) 11/13/2022    Review of Glycemic Control  Latest Reference Range & Units 11/20/22 07:13 11/20/22 11:51 11/20/22 17:32 11/20/22 21:14 11/21/22 06:36  Glucose-Capillary 70 - 99 mg/dL 166 (H) 217 (H) 306 (H)  Novolog 11 units 251 (H)  Novolog 3 units 194 (H)   Diabetes history: DM 2 Outpatient Diabetes medications: Glipizide 10 mg Daily, Soliqua 60 units Daily, Novolog 35 units tid, metformin 1000 mg 3x/week Current orders for Inpatient glycemic control: In perioperative area  A1c 9.2% on 2/6 Preadmission testing appt Received Decadron 5 mg during surgery.  -   Note glucose trends increase after PO intake. Pt is on meal coverage insulin at home.   Inpatient Diabetes Program Recommendations:    -  Consider reducing meal coverage to Novolog 8 units tid (part of home dose)   Thanks,  Tama Headings RN, MSN, BC-ADM Inpatient Diabetes Coordinator Team Pager [phone] (8a-5p)
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
## 2022-11-21 NOTE — Discharge Summary (Signed)
Discharge Summary  Date of Admission: 11/20/2022  Date of Discharge: 11/21/22  Attending Physician: Emelda Brothers, MD  Hospital Course: Patient was admitted following an C4 corpectomy. They were recovered in PACU and transferred to [facility]. Their preop symptoms were improved, their hospital course was uncomplicated and the patient was discharged home. They will follow up in clinic with me in clinic in 2 weeks.  Neurologic exam at discharge:  Strength 5/5 x4 and SILTx4 except some residual but improved right hand numbness  Discharge diagnosis: Cervical myelopathy  Judith Part, MD 11/21/22 9:20 AM
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
## 2022-11-21 NOTE — Plan of Care (Signed)
Pt doing well. Pt and wife given D/C instructions with verbal understanding. Rx's were sent to the pharmacy by MD. Pt's incision is clean and dry with no sign of infection. Pt's IV was removed prior to D/C. Pt received RW and 3-n-1 from [employer] per MD order. Pt D/C'd home via wheelchair per MD order. Pt is stable @ D/C and has no other needs at this time. Holli Humbles, RN
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
## 2023-09-20 LAB — COLOGUARD: COLOGUARD: NEGATIVE

## 2023-09-20 LAB — EXTERNAL GENERIC LAB PROCEDURE: COLOGUARD: NEGATIVE

## 2023-10-15 ENCOUNTER — Other Ambulatory Visit: Payer: Self-pay | Admitting: Family

## 2023-10-15 DIAGNOSIS — I700 Atherosclerosis of aorta: Secondary | ICD-10-CM

## 2023-10-23 ENCOUNTER — Ambulatory Visit
Admission: RE | Admit: 2023-10-23 | Discharge: 2023-10-23 | Disposition: A | Discharge status: Home / Self Care | Payer: Medicare HMO | Source: Ambulatory Visit | Attending: Family | Admitting: Family

## 2023-10-23 DIAGNOSIS — I700 Atherosclerosis of aorta: Secondary | ICD-10-CM

## 2023-11-08 ENCOUNTER — Encounter: Payer: Self-pay | Admitting: Podiatry

## 2023-11-08 ENCOUNTER — Ambulatory Visit (INDEPENDENT_AMBULATORY_CARE_PROVIDER_SITE_OTHER): Payer: Medicare HMO | Admitting: Podiatry

## 2023-11-08 VITALS — Ht 65.0 in | Wt 210.0 lb

## 2023-11-08 DIAGNOSIS — M79675 Pain in left toe(s): Secondary | ICD-10-CM | POA: Diagnosis not present

## 2023-11-08 DIAGNOSIS — M79674 Pain in right toe(s): Secondary | ICD-10-CM

## 2023-11-08 DIAGNOSIS — E119 Type 2 diabetes mellitus without complications: Secondary | ICD-10-CM | POA: Diagnosis not present

## 2023-11-08 DIAGNOSIS — B351 Tinea unguium: Secondary | ICD-10-CM

## 2023-11-08 NOTE — Progress Notes (Signed)
This patient presents to the office with chief complaint of long thick nails and diabetic feet.  This patient  says there  is  no pain and discomfort in their feet.  This patient says there are long thick painful nails.  These nails are painful walking and wearing shoes.  Patient has no history of infection or drainage from both feet.  Patient is unable to  self treat his own nails . This patient presents  to the office today for treatment of the  long nails and a foot evaluation due to history of  diabetes.  General Appearance  Alert, conversant and in no acute stress.  Vascular  Dorsalis pedis and posterior tibial  pulses are palpable  bilaterally.  Capillary return is within normal limits  bilaterally. Temperature is within normal limits  bilaterally.  Neurologic  Senn-Weinstein monofilament wire test within normal limits  bilaterally. Muscle power within normal limits bilaterally.  Nails Thick disfigured discolored nails with subungual debris  from hallux to fifth toes bilaterally. No evidence of bacterial infection or drainage bilaterally.  Orthopedic  No limitations of motion of motion feet .  No crepitus or effusions noted.  No bony pathology or digital deformities noted.  Skin  normotropic skin with no porokeratosis noted bilaterally.  No signs of infections or ulcers noted.     Onychomycosis  Diabetes with no foot complications  IE  Debride nails x 10.  A diabetic foot exam was performed and there is no evidence of any vascular or neurologic pathology.   RTC 3 months.   Gardiner Barefoot DPM

## 2024-02-07 ENCOUNTER — Ambulatory Visit: Payer: Medicare HMO | Admitting: Podiatry

## 2024-02-13 ENCOUNTER — Ambulatory Visit (INDEPENDENT_AMBULATORY_CARE_PROVIDER_SITE_OTHER): Admitting: Podiatry

## 2024-02-13 ENCOUNTER — Encounter: Payer: Self-pay | Admitting: Podiatry

## 2024-02-13 DIAGNOSIS — M79674 Pain in right toe(s): Secondary | ICD-10-CM

## 2024-02-13 DIAGNOSIS — E119 Type 2 diabetes mellitus without complications: Secondary | ICD-10-CM

## 2024-02-13 DIAGNOSIS — M79675 Pain in left toe(s): Secondary | ICD-10-CM | POA: Diagnosis not present

## 2024-02-13 DIAGNOSIS — B351 Tinea unguium: Secondary | ICD-10-CM | POA: Diagnosis not present

## 2024-02-13 NOTE — Progress Notes (Signed)
 This patient presents to the office with chief complaint of long thick nails and diabetic feet.  This patient  says there  is  no pain and discomfort in their feet.  This patient says there are long thick painful nails.  These nails are painful walking and wearing shoes.  Patient has no history of infection or drainage from both feet.  Patient is unable to  self treat his own nails . This patient presents  to the office today for treatment of the  long nails and a foot evaluation due to history of  diabetes.  General Appearance  Alert, conversant and in no acute stress.  Vascular  Dorsalis pedis and posterior tibial  pulses are palpable  bilaterally.  Capillary return is within normal limits  bilaterally. Temperature is within normal limits  bilaterally.  Neurologic  Senn-Weinstein monofilament wire test within normal limits  bilaterally. Muscle power within normal limits bilaterally.  Nails Thick disfigured discolored nails with subungual debris  from hallux to fifth toes bilaterally. No evidence of bacterial infection or drainage bilaterally.  Orthopedic  No limitations of motion of motion feet .  No crepitus or effusions noted.  No bony pathology or digital deformities noted.  Skin  normotropic skin with no porokeratosis noted bilaterally.  No signs of infections or ulcers noted.     Onychomycosis  Diabetes with no foot complications  Debride nails x 10.  A diabetic foot exam was performed and there is no evidence of any vascular or neurologic pathology.   RTC 3 months.   Ruffin Cotton DPM

## 2024-02-26 ENCOUNTER — Encounter: Payer: Self-pay | Admitting: Internal Medicine

## 2024-02-26 ENCOUNTER — Ambulatory Visit: Attending: Internal Medicine | Admitting: Internal Medicine

## 2024-02-26 VITALS — BP 136/70 | HR 78 | Ht 65.0 in | Wt 214.4 lb

## 2024-02-26 DIAGNOSIS — I471 Supraventricular tachycardia, unspecified: Secondary | ICD-10-CM | POA: Diagnosis not present

## 2024-02-26 NOTE — Patient Instructions (Addendum)
 Medication Instructions:  Your physician recommends that you continue on your current medications as directed. Please refer to the Current Medication list given to you today.  *If you need a refill on your cardiac medications before your next appointment, please call your pharmacy*  Lab Work: None ordered.  You may go to any [facility] Location for your lab work:  [facility] - [address] ([facility]) - [address] [phone] [address]  [place] - [address]   [place]  - [address]   [place] - [address] - [address] [facility]  - [address] ([facility]   If you have labs (blood work) drawn today and your tests are completely normal, you will receive your results only by: [employer] (if you have MyChart)  If you have any lab test that is abnormal or we need to change your treatment, we will call you or send a MyChart message to review the results.  Testing/Procedures: None ordered.  Follow-Up: At [facility], you and your health needs are our priority.  As part of our continuing mission to provide you with exceptional heart care, we have created designated Provider Care Teams.  These Care Teams include your primary Cardiologist (physician) and Advanced Practice Providers (APPs -  Physician Assistants and Nurse Practitioners) who all work together to provide you with the care you need, when you need it.  We recommend signing up for the patient portal called "MyChart".  Sign up information is provided on this After Visit Summary.  MyChart is used to connect with patients for Virtual Visits (Telemedicine).  Patients are able to view lab/test results, encounter notes, upcoming appointments, etc.  Non-urgent messages can be sent to your provider as well.   To learn more about what you can do with MyChart, go to  [email or web address].    Your next appointment:   To be determined  The format for your next appointment:   In Person  Provider:   Manya Sells, MD{or one of the following Advanced Practice Providers on your designated Care Team:   Mertha Abrahams, [place] [address] [place], [place] Neda Balk, NP

## 2024-02-26 NOTE — Progress Notes (Signed)
 HPI Mr. Baird is referred today by Dr. Rito Chess for evaluation of SVT. He is a pleasant 73 yo man with DM, HTN, and rare palpitations. He is a retired Metallurgist. He denies any h/o SVT. He wore a zio monitor recently and is referred for additional eval. I unfortunately do not have any records or data from his heart monitor. He denies syncope or any symptoms of sustained SVT. He admits to at least dietary  non-compliance with his DM and his last hgbA1C was over 8.  He has trouble avoiding sweet foods.   No Known Allergies   Current Outpatient Medications  Medication Sig Dispense Refill   ascorbic acid (VITAMIN C) 500 MG tablet Take 500 mg by mouth daily.     aspirin  EC 81 MG EC tablet Take 1 tablet (81 mg total) by mouth daily. 30 tablet 3   atorvastatin  (LIPITOR) 20 MG tablet Take 20 mg by mouth daily.     Benfotiamine 150 MG CAPS Take 150 mg by mouth daily.     Cholecalciferol (VITAMIN D3 PO) Take by mouth. 1 weekly     Cyanocobalamin (B-12 PO) Take 1 tablet by mouth daily.     cyclobenzaprine  (FLEXERIL ) 10 MG tablet Take 1 tablet (10 mg total) by mouth 3 (three) times daily as needed for muscle spasms. 30 tablet 0   glipiZIDE  (GLUCOTROL ) 10 MG tablet Take 10 mg by mouth daily.     insulin  aspart (NOVOLOG  FLEXPEN) 100 UNIT/ML SOPN FlexPen Inject 35 Units into the skin in the morning, at noon, and at bedtime.     levothyroxine  (SYNTHROID , LEVOTHROID) 100 MCG tablet Take 100 mcg by mouth daily before breakfast.     losartan -hydrochlorothiazide  (HYZAAR) 100-12.5 MG tablet Take 1 tablet by mouth daily.     metFORMIN  (GLUCOPHAGE ) 1000 MG tablet Take 1,000 mg by mouth 3 (three) times a week.     naphazoline-glycerin (CLEAR EYES REDNESS) 0.012-0.25 % SOLN Place 1-2 drops into both eyes 4 (four) times daily as needed for eye irritation.     niacin (VITAMIN B3) 500 MG tablet Take 500 mg by mouth at bedtime.     nitroGLYCERIN  (NITROSTAT ) 0.4 MG SL tablet Place 1 tablet (0.4 mg total)  under the tongue every 5 (five) minutes x 3 doses as needed for chest pain. 25 tablet 12   Omega-3 Fatty Acids (FISH OIL) 1000 MG CAPS Take 1,000 mg by mouth daily.     OVER THE COUNTER MEDICATION Take 1 capsule by mouth daily. Kidney Factor     OVER THE COUNTER MEDICATION Take 1 capsule by mouth daily. Healthy Feet and Nerves     OVER THE COUNTER MEDICATION Take 1 capsule by mouth daily. T-male Testoterone Boost     OVER THE COUNTER MEDICATION Take 1 capsule by mouth daily. Nitric Oxide Blood Flow Booster     oxyCODONE  (OXY IR/ROXICODONE ) 5 MG immediate release tablet Take 1 tablet (5 mg total) by mouth every 4 (four) hours as needed (pain). 30 tablet 0   SOLIQUA  100-33 UNT-MCG/ML SOPN Inject 60 Units into the skin daily.     tamsulosin  (FLOMAX ) 0.4 MG CAPS capsule Take 0.4 mg by mouth daily.     No current facility-administered medications for this visit.     Past Medical History:  Diagnosis Date   Aortic atherosclerosis (HCC)    Back pain    BPH (benign prostatic hyperplasia)    CAD (coronary artery disease)    CHF (congestive heart failure) (  HCC)    Cigarette nicotine dependence in remission    CKD (chronic kidney disease), stage III (HCC)    DM (diabetes mellitus) (HCC)    Fatty liver    Grief    History of cervical corpectomy    HLD (hyperlipidemia)    HTN (hypertension)    Hyperlipemia    Hypothyroidism (acquired)    Impacted cerumen of right ear    Necrotizing fasciitis (HCC)    Obesity    Onychogryphosis    Osteoarthritis    Vitreous hemorrhage of right eye (HCC)    Wears dentures     ROS:   All systems reviewed and negative except as noted in the HPI.   Past Surgical History:  Procedure Laterality Date   ANTERIOR CERVICAL CORPECTOMY N/A 11/20/2022   Procedure: CERVICAL FOUR CORPECTOMY;  Surgeon: Cannon Champion, MD;  Location: [facility] OR;  Service: Neurosurgery;  Laterality: N/A;   CHOLECYSTECTOMY N/A 07/02/2013   Procedure: LAPAROSCOPIC CHOLECYSTECTOMY WITH  INTRAOPERATIVE CHOLANGIOGRAM ;  Surgeon: Fran Imus, MD;  Location: [facility] OR;  Service: General;  Laterality: N/A;   COLON SURGERY     diverting colostomy and I&D fourniers  04/10/02   Dr. Jerryl Morin   reversal of colostomy  07/27/02    Dr. Jerryl Morin   SKIN GRAFT  05/08/02   Dr. Hobart Lulas     Family History  Problem Relation Age of Onset   COPD Mother    Lung cancer Father    Diabetes Paternal Uncle    Hearing loss Paternal Uncle      Social History   Socioeconomic History   Marital status: Married    Spouse name: Not on file   Number of children: Not on file   Years of education: Not on file   Highest education level: Not on file  Occupational History   Not on file  Tobacco Use   Smoking status: Former    Current packs/day: 0.00    Types: Cigarettes    Quit date: 03/08/2004    Years since quitting: 19.9   Smokeless tobacco: Never  Vaping Use   Vaping status: Never Used  Substance and Sexual Activity   Alcohol use: No   Drug use: No   Sexual activity: Yes    Birth control/protection: None  Other Topics Concern   Not on file  Social History Narrative   Not on file   Social Drivers of Health   Financial Resource Strain: Not on file  Food Insecurity: Not on file  Transportation Needs: Not on file  Physical Activity: Not on file  Stress: Not on file  Social Connections: Not on file  Intimate Partner Violence: Not on file     BP 136/70   Pulse 78   Ht 5\' 5"  (1.651 m)   Wt 214 lb 6.4 oz (97.3 kg)   SpO2 97%   BMI 35.68 kg/m   Physical Exam:  Well appearing 73 yo man, NAD HEENT: Unremarkable Neck:  No JVD, no thyromegally Lymphatics:  No adenopathy Back:  No CVA tenderness Lungs:  Clear with no wheezes HEART:  Regular rate rhythm, no murmurs, no rubs, no clicks Abd:  soft, positive bowel sounds, no organomegally, no rebound, no guarding Ext:  2 plus pulses, no edema, no cyanosis, no clubbing Skin:  No rashes no nodules Neuro:  CN II through XII  intact, motor grossly intact  EKG - nsr  Assess/Plan: Possible SVT - we will look for the data from his Zio  monitor. No change in meds for now. Could consider adding a beta blocker.  Dyslipidemia - he will continue lipitor for now.  HTN - his bp is reasonably well controlled.   Pete Brand Alizee Maple,MD

## 2024-06-15 ENCOUNTER — Ambulatory Visit: Admitting: Podiatry

## 2024-06-15 ENCOUNTER — Encounter: Payer: Self-pay | Admitting: Podiatry

## 2024-06-15 DIAGNOSIS — M5000 Cervical disc disorder with myelopathy, unspecified cervical region: Secondary | ICD-10-CM | POA: Insufficient documentation

## 2024-06-15 DIAGNOSIS — M79675 Pain in left toe(s): Secondary | ICD-10-CM

## 2024-06-15 DIAGNOSIS — B351 Tinea unguium: Secondary | ICD-10-CM

## 2024-06-15 DIAGNOSIS — M79674 Pain in right toe(s): Secondary | ICD-10-CM

## 2024-06-15 DIAGNOSIS — M47812 Spondylosis without myelopathy or radiculopathy, cervical region: Secondary | ICD-10-CM | POA: Insufficient documentation

## 2024-06-15 DIAGNOSIS — E119 Type 2 diabetes mellitus without complications: Secondary | ICD-10-CM

## 2024-06-15 NOTE — Progress Notes (Signed)
 This patient presents to the office with chief complaint of long thick nails and diabetic feet.  This patient  says there  is  no pain and discomfort in their feet.  This patient says there are long thick painful nails.  These nails are painful walking and wearing shoes.  Patient has no history of infection or drainage from both feet.  Patient is unable to  self treat his own nails . This patient presents  to the office today for treatment of the  long nails and a foot evaluation due to history of  diabetes.  General Appearance  Alert, conversant and in no acute stress.  Vascular  Dorsalis pedis and posterior tibial  pulses are palpable  bilaterally.  Capillary return is within normal limits  bilaterally. Temperature is within normal limits  bilaterally.  Neurologic  Senn-Weinstein monofilament wire test within normal limits  bilaterally. Muscle power within normal limits bilaterally.  Nails Thick disfigured discolored nails with subungual debris  from hallux to fifth toes bilaterally. No evidence of bacterial infection or drainage bilaterally.  Orthopedic  No limitations of motion of motion feet .  No crepitus or effusions noted.  No bony pathology or digital deformities noted.  Skin  normotropic skin with no porokeratosis noted bilaterally.  No signs of infections or ulcers noted.     Onychomycosis  Diabetes with no foot complications  Debride nails x 10.  A diabetic foot exam was performed and there is no evidence of any vascular or neurologic pathology.   RTC 4  months.   Cordella Bold DPM
